# Patient Record
Sex: Female | Born: 1990 | Race: Black or African American | Hispanic: No | Marital: Single | Smoking: Former smoker
Health system: Southern US, Community
[De-identification: ages and names within clinical notes are randomized; demographics above are authoritative.]

## PROBLEM LIST (undated history)

## (undated) HISTORY — PX: CHOLECYSTECTOMY: SHX55

---

## 2016-06-06 ENCOUNTER — Ambulatory Visit: Payer: Medicaid Other | Admitting: Sports Medicine

## 2016-07-02 ENCOUNTER — Ambulatory Visit: Payer: Medicaid Other | Admitting: Sports Medicine

## 2019-03-09 ENCOUNTER — Emergency Department (HOSPITAL_BASED_OUTPATIENT_CLINIC_OR_DEPARTMENT_OTHER)
Admission: EM | Admit: 2019-03-09 | Discharge: 2019-03-09 | Disposition: A | Discharge status: Home / Self Care | Payer: Medicaid Other | Attending: Emergency Medicine | Admitting: Emergency Medicine

## 2019-03-09 ENCOUNTER — Other Ambulatory Visit: Payer: Self-pay

## 2019-03-09 ENCOUNTER — Encounter (HOSPITAL_BASED_OUTPATIENT_CLINIC_OR_DEPARTMENT_OTHER): Payer: Self-pay

## 2019-03-09 DIAGNOSIS — F17210 Nicotine dependence, cigarettes, uncomplicated: Secondary | ICD-10-CM | POA: Insufficient documentation

## 2019-03-09 DIAGNOSIS — N760 Acute vaginitis: Secondary | ICD-10-CM | POA: Diagnosis not present

## 2019-03-09 DIAGNOSIS — N898 Other specified noninflammatory disorders of vagina: Secondary | ICD-10-CM | POA: Diagnosis present

## 2019-03-09 DIAGNOSIS — B9689 Other specified bacterial agents as the cause of diseases classified elsewhere: Secondary | ICD-10-CM

## 2019-03-09 DIAGNOSIS — N3000 Acute cystitis without hematuria: Secondary | ICD-10-CM | POA: Diagnosis not present

## 2019-03-09 LAB — URINALYSIS, MICROSCOPIC (REFLEX)

## 2019-03-09 LAB — URINALYSIS, ROUTINE W REFLEX MICROSCOPIC
Bilirubin Urine: NEGATIVE
Glucose, UA: NEGATIVE mg/dL
Ketones, ur: NEGATIVE mg/dL
Leukocytes,Ua: NEGATIVE
Nitrite: POSITIVE — AB
Protein, ur: NEGATIVE mg/dL
Specific Gravity, Urine: 1.025 (ref 1.005–1.030)
pH: 7 (ref 5.0–8.0)

## 2019-03-09 LAB — WET PREP, GENITAL
Sperm: NONE SEEN
Trich, Wet Prep: NONE SEEN
Yeast Wet Prep HPF POC: NONE SEEN

## 2019-03-09 LAB — PREGNANCY, URINE: Preg Test, Ur: NEGATIVE

## 2019-03-09 MED ORDER — CEPHALEXIN 500 MG PO CAPS: 500.0000 mg | ORAL_CAPSULE | Freq: Three times a day (TID) | ORAL | 0 refills | Status: DC

## 2019-03-09 MED ORDER — ONDANSETRON 4 MG PO TBDP
4.0000 mg | ORAL_TABLET | Freq: Once | ORAL | Status: AC
  Administered 2019-03-09: 19:00:00 4 mg via ORAL
  Filled 2019-03-09: qty 1

## 2019-03-09 MED ORDER — METRONIDAZOLE 500 MG PO TABS
2000.0000 mg | ORAL_TABLET | Freq: Once | ORAL | Status: AC
  Administered 2019-03-09: 2000 mg via ORAL
  Filled 2019-03-09: qty 4

## 2019-03-09 NOTE — ED Notes (Signed)
Patient stated that she has cramping feeling to her RUQ because she is on her period???

## 2019-03-09 NOTE — ED Triage Notes (Signed)
Pt reports +STD exposure-vaginal d/c x 2 weeks-NAD-steady gait 

## 2019-03-09 NOTE — ED Notes (Signed)
ED Provider at bedside. 

## 2019-03-09 NOTE — Discharge Instructions (Signed)
Your testing today did not show trichomonas, but you do have bacterial vaginosis (not a STD) and also a urinary tract infection. Your medication you received in the ER will treat the bacterial vaginosis. The prescription is a different antibiotic to treat the UTI.

## 2019-03-11 LAB — URINE CULTURE: Culture: >=100000 — AB

## 2019-03-11 LAB — GC/CHLAMYDIA PROBE AMP (~~LOC~~) NOT AT ARMC
Chlamydia: NEGATIVE
Neisseria Gonorrhea: NEGATIVE

## 2019-03-11 NOTE — ED Provider Notes (Signed)
[facility] EMERGENCY DEPARTMENT Provider Note   CSN: [id number] Arrival date & time: 03/09/19  1646     History   Chief Complaint Chief Complaint  Patient presents with  . Exposure to STD  . Vaginal Discharge    HPI Sabrina Walter is a 28 y.o. female.     HPI   28 year old female presenting requesting STD testing.  She reports her boyfriend was recently treated for trichomonas.  She reports about 2-week history of vaginal discharge and some dysuria.  No abdominal pain.  No unusual bleeding.  She does not think she is pregnant.  History reviewed. No pertinent past medical history.  There are no active problems to display for this patient.   Past Surgical History:  Procedure Laterality Date  . CESAREAN SECTION    . CHOLECYSTECTOMY       OB History   No obstetric history on file.      Home Medications    Prior to Admission medications   Medication Sig Start Date End Date Taking? Authorizing Provider  cephALEXin (KEFLEX) 500 MG capsule Take 1 capsule (500 mg total) by mouth 3 (three) times daily. 03/09/19   Raeford Razor, MD    Family History No family history on file.  Social History Social History   Tobacco Use  . Smoking status: Current Every Day Smoker    Types: Cigarettes  . Smokeless tobacco: Never Used  Substance Use Topics  . Alcohol use: Yes    Frequency: Never    Comment: occ  . Drug use: Never     Allergies   Patient has no known allergies.   Review of Systems Review of Systems  All systems reviewed and negative, other than as noted in HPI.  Physical Exam Updated Vital Signs BP 137/84 (BP Location: Right Arm)   Pulse 72   Temp (!) 97.5 F (36.4 C) (Oral)   Resp 16   Ht 5\' 9"  (1.753 m)   Wt 121.6 kg   LMP 03/06/2019   SpO2 100%   BMI 39.58 kg/m   Physical Exam Vitals signs and nursing note reviewed.  Constitutional:      General: She is not in acute distress.    Appearance: She is well-developed.  HENT:      Head: Normocephalic and atraumatic.  Eyes:     General:        Right eye: No discharge.        Left eye: No discharge.     Conjunctiva/sclera: Conjunctivae normal.  Neck:     Musculoskeletal: Neck supple.  Cardiovascular:     Rate and Rhythm: Normal rate and regular rhythm.     Heart sounds: Normal heart sounds. No murmur. No friction rub. No gallop.   Pulmonary:     Effort: Pulmonary effort is normal. No respiratory distress.     Breath sounds: Normal breath sounds.  Abdominal:     General: There is no distension.     Palpations: Abdomen is soft.     Tenderness: There is no abdominal tenderness.  Genitourinary:    Comments: Chaperone present.  Normal external female genitalia.  No concerning lesions noted.  Moderate mucoid whitish discharge.  Cervix grossly normal in appearance.  No CMT or adnexal mass/tenderness. Musculoskeletal:        General: No tenderness.  Skin:    General: Skin is warm and dry.  Neurological:     Mental Status: She is alert.  Psychiatric:  Behavior: Behavior normal.        Thought Content: Thought content normal.      ED Treatments / Results  Labs (all labs ordered are listed, but only abnormal results are displayed) Labs Reviewed  WET PREP, GENITAL - Abnormal; Notable for the following components:      Result Value   Clue Cells Wet Prep HPF POC PRESENT (*)    WBC, Wet Prep HPF POC FEW (*)    All other components within normal limits  URINE CULTURE - Abnormal; Notable for the following components:   Culture >=100,000 COLONIES/mL ESCHERICHIA COLI (*)    Organism ID, Bacteria ESCHERICHIA COLI (*)    All other components within normal limits  URINALYSIS, ROUTINE W REFLEX MICROSCOPIC - Abnormal; Notable for the following components:   APPearance CLOUDY (*)    Hgb urine dipstick LARGE (*)    Nitrite POSITIVE (*)    All other components within normal limits  URINALYSIS, MICROSCOPIC (REFLEX) - Abnormal; Notable for the following  components:   Bacteria, UA MANY (*)    All other components within normal limits  PREGNANCY, URINE  GC/CHLAMYDIA PROBE AMP ([place]) NOT AT [facility]    EKG None  Radiology No results found.  Procedures Procedures (including critical care time)  Medications Ordered in ED Medications  metroNIDAZOLE (FLAGYL) tablet 2,000 mg (2,000 mg Oral Given 03/09/19 1847)  ondansetron (ZOFRAN-ODT) disintegrating tablet 4 mg (4 mg Oral Given 03/09/19 1847)     Initial Impression / Assessment and Plan / ED Course  I have reviewed the triage vital signs and the nursing notes.  Pertinent labs & imaging results that were available during my care of the patient were reviewed by me and considered in my medical decision making (see chart for details).        28 year old female with vaginal discharge and reported exposure to trichomonas.  Negative for trichomonas today but does appear to have a UTI and bacterial vaginosis.  Flagyl should cover for possible trichomoniasis regardless.  Additional antibiotics for UTI.  Return precautions were discussed.  Final Clinical Impressions(s) / ED Diagnoses   Final diagnoses:  Acute cystitis without hematuria  BV (bacterial vaginosis)    ED Discharge Orders         Ordered    cephALEXin (KEFLEX) 500 MG capsule  3 times daily     03/09/19 1831           Virgel Manifold, MD 03/11/19 1635

## 2019-03-13 ENCOUNTER — Telehealth: Payer: Self-pay | Admitting: Emergency Medicine

## 2019-03-13 NOTE — Telephone Encounter (Signed)
Post ED Visit - Positive Culture Follow-up  Culture report reviewed by antimicrobial stewardship pharmacist: [place] Team []  Elenor Quinones, Pharm.D. []  Heide Guile, Pharm.D., BCPS AQ-ID []  Parks Neptune, Pharm.D., BCPS []  Alycia Rossetti, Pharm.D., BCPS []  [place], [place].D., BCPS, AAHIVP []  Legrand Como, Pharm.D., BCPS, AAHIVP []  Salome Arnt, PharmD, BCPS []  Johnnette Gourd, PharmD, BCPS []  Hughes Better, PharmD, BCPS [x]  Elicia Lamp, PharmD []  Laqueta Linden, PharmD, BCPS []  Albertina Parr, PharmD  [place] Team []  Leodis Sias, PharmD []  Lindell Spar, PharmD []  Royetta Asal, PharmD []  Graylin Shiver, Rph []  Rema Fendt) Glennon Mac, PharmD []  Arlyn Dunning, PharmD []  Netta Cedars, PharmD []  Dia Sitter, PharmD []  Leone Haven, PharmD []  Gretta Arab, PharmD []  Theodis Shove, PharmD []  Peggyann Juba, PharmD []  Reuel Boom, PharmD   Positive urine culture Treated with Cephalexin, organism sensitive to the same and no further patient follow-up is required at this time.  Sandi Raveling Rena Sweeden 03/13/2019, 2:37 PM

## 2019-05-09 ENCOUNTER — Other Ambulatory Visit: Payer: Self-pay

## 2019-05-09 ENCOUNTER — Encounter (HOSPITAL_BASED_OUTPATIENT_CLINIC_OR_DEPARTMENT_OTHER): Payer: Self-pay

## 2019-05-09 ENCOUNTER — Emergency Department (HOSPITAL_BASED_OUTPATIENT_CLINIC_OR_DEPARTMENT_OTHER)
Admission: EM | Admit: 2019-05-09 | Discharge: 2019-05-09 | Disposition: A | Discharge status: Home / Self Care | Payer: Medicaid Other | Attending: Emergency Medicine | Admitting: Emergency Medicine

## 2019-05-09 DIAGNOSIS — R109 Unspecified abdominal pain: Secondary | ICD-10-CM | POA: Insufficient documentation

## 2019-05-09 DIAGNOSIS — Z5320 Procedure and treatment not carried out because of patient's decision for unspecified reasons: Secondary | ICD-10-CM | POA: Insufficient documentation

## 2019-05-09 LAB — URINALYSIS, ROUTINE W REFLEX MICROSCOPIC
Bilirubin Urine: NEGATIVE
Glucose, UA: NEGATIVE mg/dL
Hgb urine dipstick: NEGATIVE
Ketones, ur: NEGATIVE mg/dL
Leukocytes,Ua: NEGATIVE
Nitrite: NEGATIVE
Protein, ur: NEGATIVE mg/dL
Specific Gravity, Urine: >1.03 — ABNORMAL HIGH (ref 1.005–1.030)
pH: 6 (ref 5.0–8.0)

## 2019-05-09 LAB — PREGNANCY, URINE: Preg Test, Ur: POSITIVE — AB

## 2019-05-09 NOTE — ED Triage Notes (Signed)
Pt c/o right side abd pain, n/v x 2 weeks-pt states she had a pos home preg test-can not recall LMP-NAD-steady gait

## 2019-05-10 ENCOUNTER — Encounter (HOSPITAL_COMMUNITY)
Admission: AD | Disposition: A | Discharge status: Home / Self Care | Payer: Self-pay | Source: Home / Self Care | Attending: Emergency Medicine

## 2019-05-10 ENCOUNTER — Inpatient Hospital Stay (HOSPITAL_COMMUNITY): Payer: Medicaid Other

## 2019-05-10 ENCOUNTER — Inpatient Hospital Stay (HOSPITAL_COMMUNITY): Payer: Medicaid Other | Admitting: Certified Registered Nurse Anesthetist

## 2019-05-10 ENCOUNTER — Ambulatory Visit (HOSPITAL_BASED_OUTPATIENT_CLINIC_OR_DEPARTMENT_OTHER)
Admission: AD | Admit: 2019-05-10 | Discharge: 2019-05-10 | Disposition: A | Discharge status: Home / Self Care | Payer: Medicaid Other | Attending: Obstetrics & Gynecology | Admitting: Obstetrics & Gynecology

## 2019-05-10 ENCOUNTER — Encounter: Payer: Self-pay | Admitting: Oncology

## 2019-05-10 DIAGNOSIS — O00101 Right tubal pregnancy without intrauterine pregnancy: Secondary | ICD-10-CM | POA: Diagnosis not present

## 2019-05-10 DIAGNOSIS — Z3A08 8 weeks gestation of pregnancy: Secondary | ICD-10-CM | POA: Insufficient documentation

## 2019-05-10 DIAGNOSIS — Z20828 Contact with and (suspected) exposure to other viral communicable diseases: Secondary | ICD-10-CM | POA: Insufficient documentation

## 2019-05-10 DIAGNOSIS — O99331 Smoking (tobacco) complicating pregnancy, first trimester: Secondary | ICD-10-CM | POA: Insufficient documentation

## 2019-05-10 DIAGNOSIS — O009 Unspecified ectopic pregnancy without intrauterine pregnancy: Secondary | ICD-10-CM | POA: Diagnosis present

## 2019-05-10 DIAGNOSIS — R1031 Right lower quadrant pain: Secondary | ICD-10-CM

## 2019-05-10 DIAGNOSIS — F17210 Nicotine dependence, cigarettes, uncomplicated: Secondary | ICD-10-CM | POA: Diagnosis not present

## 2019-05-10 DIAGNOSIS — R109 Unspecified abdominal pain: Secondary | ICD-10-CM

## 2019-05-10 DIAGNOSIS — O26899 Other specified pregnancy related conditions, unspecified trimester: Secondary | ICD-10-CM

## 2019-05-10 HISTORY — PX: DIAGNOSTIC LAPAROSCOPY WITH REMOVAL OF ECTOPIC PREGNANCY: SHX6449

## 2019-05-10 LAB — WET PREP, GENITAL
Clue Cells Wet Prep HPF POC: NONE SEEN
Sperm: NONE SEEN
Trich, Wet Prep: NONE SEEN

## 2019-05-10 LAB — URINALYSIS, ROUTINE W REFLEX MICROSCOPIC
Bilirubin Urine: NEGATIVE
Glucose, UA: NEGATIVE mg/dL
Ketones, ur: NEGATIVE mg/dL
Leukocytes,Ua: NEGATIVE
Nitrite: NEGATIVE
Protein, ur: NEGATIVE mg/dL
Specific Gravity, Urine: >1.03 — ABNORMAL HIGH (ref 1.005–1.030)
pH: 6 (ref 5.0–8.0)

## 2019-05-10 LAB — CBC WITH DIFFERENTIAL/PLATELET
Abs Immature Granulocytes: 0.01 10*3/uL (ref 0.00–0.07)
Basophils Absolute: 0 10*3/uL (ref 0.0–0.1)
Basophils Relative: 1 %
Eosinophils Absolute: 0.2 10*3/uL (ref 0.0–0.5)
Eosinophils Relative: 2 %
HCT: 38.3 % (ref 36.0–46.0)
Hemoglobin: 12.7 g/dL (ref 12.0–15.0)
Immature Granulocytes: 0 %
Lymphocytes Relative: 34 %
Lymphs Abs: 2.7 10*3/uL (ref 0.7–4.0)
MCH: 31.1 pg (ref 26.0–34.0)
MCHC: 33.2 g/dL (ref 30.0–36.0)
MCV: 93.6 fL (ref 80.0–100.0)
Monocytes Absolute: 0.7 10*3/uL (ref 0.1–1.0)
Monocytes Relative: 9 %
Neutro Abs: 4.3 10*3/uL (ref 1.7–7.7)
Neutrophils Relative %: 54 %
Platelets: 224 10*3/uL (ref 150–400)
RBC: 4.09 MIL/uL (ref 3.87–5.11)
RDW: 13.1 % (ref 11.5–15.5)
WBC: 7.9 10*3/uL (ref 4.0–10.5)
nRBC: 0 % (ref 0.0–0.2)

## 2019-05-10 LAB — ABO/RH: ABO/RH(D): A POS

## 2019-05-10 LAB — URINALYSIS, MICROSCOPIC (REFLEX)

## 2019-05-10 LAB — HCG, QUANTITATIVE, PREGNANCY: hCG, Beta Chain, Quant, S: 38497 m[IU]/mL — ABNORMAL HIGH (ref <5)

## 2019-05-10 LAB — SARS CORONAVIRUS 2 BY RT PCR (HOSPITAL ORDER, PERFORMED IN ~~LOC~~ HOSPITAL LAB): SARS Coronavirus 2: NEGATIVE

## 2019-05-10 LAB — TYPE AND SCREEN
ABO/RH(D): A POS
Antibody Screen: NEGATIVE

## 2019-05-10 SURGERY — LAPAROSCOPY, WITH ECTOPIC PREGNANCY SURGICAL TREATMENT
Anesthesia: General | Laterality: Right

## 2019-05-10 MED ORDER — BUPIVACAINE HCL (PF) 0.25 % IJ SOLN
INTRAMUSCULAR | Status: AC
  Filled 2019-05-10: qty 30

## 2019-05-10 MED ORDER — PROPOFOL 10 MG/ML IV BOLUS
INTRAVENOUS | Status: AC
  Filled 2019-05-10: qty 20

## 2019-05-10 MED ORDER — FENTANYL CITRATE (PF) 100 MCG/2ML IJ SOLN
25.0000 ug | INTRAMUSCULAR | Status: DC | PRN
  Administered 2019-05-10: 25 ug via INTRAVENOUS
  Administered 2019-05-10: 75 ug via INTRAVENOUS

## 2019-05-10 MED ORDER — BUPIVACAINE HCL 0.5 % IJ SOLN
INTRAMUSCULAR | Status: DC | PRN
  Administered 2019-05-10: 20 mL

## 2019-05-10 MED ORDER — OXYCODONE HCL 5 MG PO TABS
ORAL_TABLET | ORAL | Status: AC
  Filled 2019-05-10: qty 2

## 2019-05-10 MED ORDER — FENTANYL CITRATE (PF) 100 MCG/2ML IJ SOLN
INTRAMUSCULAR | Status: AC
  Filled 2019-05-10: qty 2

## 2019-05-10 MED ORDER — LACTATED RINGERS IV SOLN: INTRAVENOUS | Status: DC

## 2019-05-10 MED ORDER — IBUPROFEN 800 MG PO TABS: 800.0000 mg | ORAL_TABLET | Freq: Three times a day (TID) | ORAL | 0 refills | Status: AC | PRN

## 2019-05-10 MED ORDER — SUGAMMADEX SODIUM 200 MG/2ML IV SOLN
INTRAVENOUS | Status: DC | PRN
  Administered 2019-05-10: 200 mg via INTRAVENOUS

## 2019-05-10 MED ORDER — LIDOCAINE 2% (20 MG/ML) 5 ML SYRINGE
INTRAMUSCULAR | Status: DC | PRN
  Administered 2019-05-10: 60 mg via INTRAVENOUS

## 2019-05-10 MED ORDER — KETOROLAC TROMETHAMINE 30 MG/ML IJ SOLN
INTRAMUSCULAR | Status: DC | PRN
  Administered 2019-05-10: 30 mg via INTRAVENOUS

## 2019-05-10 MED ORDER — PHENYLEPHRINE HCL (PRESSORS) 10 MG/ML IV SOLN
INTRAVENOUS | Status: DC | PRN
  Administered 2019-05-10: 150 ug via INTRAVENOUS
  Administered 2019-05-10: 60 ug via INTRAVENOUS

## 2019-05-10 MED ORDER — ROCURONIUM BROMIDE 10 MG/ML (PF) SYRINGE
PREFILLED_SYRINGE | INTRAVENOUS | Status: AC
  Filled 2019-05-10: qty 10

## 2019-05-10 MED ORDER — OXYCODONE HCL 5 MG PO TABS
5.0000 mg | ORAL_TABLET | Freq: Once | ORAL | Status: AC
  Administered 2019-05-10: 10 mg via ORAL

## 2019-05-10 MED ORDER — PROPOFOL 10 MG/ML IV BOLUS
INTRAVENOUS | Status: DC | PRN
  Administered 2019-05-10: 100 mg via INTRAVENOUS
  Administered 2019-05-10: 200 mg via INTRAVENOUS

## 2019-05-10 MED ORDER — SODIUM CHLORIDE 0.9 % IR SOLN
Status: DC | PRN
  Administered 2019-05-10: 1000 mL

## 2019-05-10 MED ORDER — KETOROLAC TROMETHAMINE 15 MG/ML IJ SOLN: 15.0000 mg | INTRAMUSCULAR | Status: DC

## 2019-05-10 MED ORDER — ONDANSETRON HCL 4 MG/2ML IJ SOLN: 4.0000 mg | Freq: Once | INTRAMUSCULAR | Status: DC | PRN

## 2019-05-10 MED ORDER — KETOROLAC TROMETHAMINE 30 MG/ML IJ SOLN
INTRAMUSCULAR | Status: AC
  Filled 2019-05-10: qty 1

## 2019-05-10 MED ORDER — DEXAMETHASONE SODIUM PHOSPHATE 10 MG/ML IJ SOLN
INTRAMUSCULAR | Status: DC | PRN
  Administered 2019-05-10: 5 mg via INTRAVENOUS

## 2019-05-10 MED ORDER — SUCCINYLCHOLINE CHLORIDE 200 MG/10ML IV SOSY
PREFILLED_SYRINGE | INTRAVENOUS | Status: DC | PRN
  Administered 2019-05-10: 120 mg via INTRAVENOUS

## 2019-05-10 MED ORDER — MIDAZOLAM HCL 2 MG/2ML IJ SOLN
INTRAMUSCULAR | Status: AC
  Filled 2019-05-10: qty 2

## 2019-05-10 MED ORDER — MIDAZOLAM HCL 2 MG/2ML IJ SOLN
INTRAMUSCULAR | Status: DC | PRN
  Administered 2019-05-10: 2 mg via INTRAVENOUS

## 2019-05-10 MED ORDER — FENTANYL CITRATE (PF) 250 MCG/5ML IJ SOLN
INTRAMUSCULAR | Status: AC
  Filled 2019-05-10: qty 5

## 2019-05-10 MED ORDER — ONDANSETRON HCL 4 MG/2ML IJ SOLN
INTRAMUSCULAR | Status: DC | PRN
  Administered 2019-05-10: 4 mg via INTRAVENOUS

## 2019-05-10 MED ORDER — HYDROCODONE-ACETAMINOPHEN 5-325 MG PO TABS: 1.0000 | ORAL_TABLET | Freq: Four times a day (QID) | ORAL | 0 refills | Status: DC | PRN

## 2019-05-10 MED ORDER — FENTANYL CITRATE (PF) 250 MCG/5ML IJ SOLN
INTRAMUSCULAR | Status: DC | PRN
  Administered 2019-05-10: 100 ug via INTRAVENOUS
  Administered 2019-05-10 (×6): 50 ug via INTRAVENOUS

## 2019-05-10 MED ORDER — DEXAMETHASONE SODIUM PHOSPHATE 10 MG/ML IJ SOLN
INTRAMUSCULAR | Status: AC
  Filled 2019-05-10: qty 1

## 2019-05-10 MED ORDER — ONDANSETRON HCL 4 MG/2ML IJ SOLN
INTRAMUSCULAR | Status: AC
  Filled 2019-05-10: qty 2

## 2019-05-10 MED ORDER — ROCURONIUM BROMIDE 10 MG/ML (PF) SYRINGE
PREFILLED_SYRINGE | INTRAVENOUS | Status: DC | PRN
  Administered 2019-05-10: 40 mg via INTRAVENOUS
  Administered 2019-05-10: 10 mg via INTRAVENOUS

## 2019-05-10 MED ORDER — SOD CITRATE-CITRIC ACID 500-334 MG/5ML PO SOLN
30.0000 mL | ORAL | Status: AC
  Administered 2019-05-10: 30 mL via ORAL
  Filled 2019-05-10: qty 30

## 2019-05-10 MED ORDER — BUPIVACAINE HCL 0.5 % IJ SOLN
INTRAMUSCULAR | Status: AC
  Filled 2019-05-10: qty 1

## 2019-05-10 SURGICAL SUPPLY — 39 items
APPLICATOR ARISTA FLEXITIP XL (MISCELLANEOUS) IMPLANT
DERMABOND ADHESIVE PROPEN (GAUZE/BANDAGES/DRESSINGS) ×1
DERMABOND ADVANCED (GAUZE/BANDAGES/DRESSINGS) ×1
DERMABOND ADVANCED .7 DNX12 (GAUZE/BANDAGES/DRESSINGS) ×1 IMPLANT
DERMABOND ADVANCED .7 DNX6 (GAUZE/BANDAGES/DRESSINGS) IMPLANT
DRSG OPSITE POSTOP 3X4 (GAUZE/BANDAGES/DRESSINGS) ×2 IMPLANT
DURAPREP 26ML APPLICATOR (WOUND CARE) ×2 IMPLANT
GLOVE BIO SURGEON STRL SZ7.5 (GLOVE) ×2 IMPLANT
GLOVE BIOGEL PI IND STRL 7.0 (GLOVE) ×2 IMPLANT
GLOVE BIOGEL PI IND STRL 8 (GLOVE) ×1 IMPLANT
GLOVE BIOGEL PI INDICATOR 7.0 (GLOVE) ×2
GLOVE BIOGEL PI INDICATOR 8 (GLOVE) ×1
GOWN STRL REUS W/ TWL LRG LVL3 (GOWN DISPOSABLE) ×1 IMPLANT
GOWN STRL REUS W/ TWL XL LVL3 (GOWN DISPOSABLE) ×1 IMPLANT
GOWN STRL REUS W/TWL LRG LVL3 (GOWN DISPOSABLE) ×1
GOWN STRL REUS W/TWL XL LVL3 (GOWN DISPOSABLE) ×1
HEMOSTAT ARISTA ABSORB 3G PWDR (HEMOSTASIS) IMPLANT
KIT TURNOVER KIT B (KITS) ×2 IMPLANT
NDL INSUFF ACCESS 14 VERSASTEP (NEEDLE) ×1 IMPLANT
NS IRRIG 1000ML POUR BTL (IV SOLUTION) ×2 IMPLANT
PACK LAPAROSCOPY BASIN (CUSTOM PROCEDURE TRAY) ×2 IMPLANT
PACK TRENDGUARD 450 HYBRID PRO (MISCELLANEOUS) ×1 IMPLANT
POUCH SPECIMEN RETRIEVAL 10MM (ENDOMECHANICALS) IMPLANT
PROTECTOR NERVE ULNAR (MISCELLANEOUS) ×4 IMPLANT
SET IRRIG TUBING LAPAROSCOPIC (IRRIGATION / IRRIGATOR) ×1 IMPLANT
SET TUBE SMOKE EVAC HIGH FLOW (TUBING) ×2 IMPLANT
SHEARS HARMONIC ACE PLUS 36CM (ENDOMECHANICALS) ×1 IMPLANT
SLEEVE ENDOPATH XCEL 5M (ENDOMECHANICALS) ×1 IMPLANT
SLEEVE XCEL OPT CAN 5 100 (ENDOMECHANICALS) ×2 IMPLANT
SUT MNCRL AB 4-0 PS2 18 (SUTURE) ×2 IMPLANT
SUT VICRYL 0 UR6 27IN ABS (SUTURE) ×2 IMPLANT
TOWEL GREEN STERILE FF (TOWEL DISPOSABLE) ×4 IMPLANT
TRAY FOLEY W/BAG SLVR 14FR (SET/KITS/TRAYS/PACK) ×2 IMPLANT
TRENDGUARD 450 HYBRID PRO PACK (MISCELLANEOUS) ×2
TROCAR BALLN 12MMX100 BLUNT (TROCAR) ×2 IMPLANT
TROCAR VERSASTEP PLUS 12MM (TROCAR) IMPLANT
TROCAR VERSASTEP PLUS 5MM (TROCAR) IMPLANT
TROCAR XCEL NON-BLD 5MMX100MML (ENDOMECHANICALS) ×2 IMPLANT
WARMER LAPAROSCOPE (MISCELLANEOUS) ×2 IMPLANT

## 2019-05-10 NOTE — Transfer of Care (Signed)
Immediate Anesthesia Transfer of Care Note  Patient: Sabrina Walter  Procedure(s) Performed: DIAGNOSTIC LAPAROSCOPY WITH REMOVAL OF ECTOPIC PREGNANCY (Right )  Patient Location: PACU  Anesthesia Type:General  Level of Consciousness: awake and alert   Airway & Oxygen Therapy: Patient Spontanous Breathing and Patient connected to nasal cannula oxygen  Post-op Assessment: Report given to RN, Post -op Vital signs reviewed and stable and Patient moving all extremities X 4  Post vital signs: Reviewed and stable  Last Vitals:  Vitals Value Taken Time  BP 113/60 05/10/19 1047  Temp    Pulse 93 05/10/19 1047  Resp 14 05/10/19 1047  SpO2 100 % 05/10/19 1047  Vitals shown include unvalidated device data.  Last Pain:  Vitals:   05/10/19 0656  TempSrc:   PainSc: 0-No pain      Patients Stated Pain Goal: 6 (91/47/82 9562)  Complications: No apparent anesthesia complications

## 2019-05-10 NOTE — ED Provider Notes (Signed)
[facility] EMERGENCY DEPARTMENT Provider Note   CSN: [id number] Arrival date & time: 05/10/19  0356     History Chief Complaint  Patient presents with  . Abdominal Pain    Sabrina Walter is a 28 y.o. female.  HPI     This is a 28 year old G3, P2 female with recent positive pregnancy test who presents with abdominal pain.  Patient reports that she had a positive home pregnancy test on Thursday.  She has developed some crampy right lower quadrant pain.  It has become more persistent.  Nothing seems to make it better or worse.  It does not radiate.  Currently her pain is a 6 out of 10.  She has not noted any urinary symptoms.  She does report intermittent vaginal bleeding but no loss of clots.  She was seen and evaluated at [facility] but left before full evaluation.  She states that she had to go to work.  She is unsure of when her next last menstrual period was but believes it was October.  Review of outside records from December 18 reveal that she had a positive quant of 24,249.  She was also noted to be a positive.  She did not have any imaging to verify location of pregnancy.    History reviewed. No pertinent past medical history.  There are no problems to display for this patient.   Past Surgical History:  Procedure Laterality Date  . CESAREAN SECTION    . CHOLECYSTECTOMY       OB History    Gravida  1   Para      Term      Preterm      AB      Living        SAB      TAB      Ectopic      Multiple      Live Births              No family history on file.  Social History   Tobacco Use  . Smoking status: Current Every Day Smoker    Types: Cigarettes  . Smokeless tobacco: Never Used  Substance Use Topics  . Alcohol use: Not Currently  . Drug use: Never    Home Medications Prior to Admission medications   Medication Sig Start Date End Date Taking? Authorizing Provider  cephALEXin (KEFLEX) 500 MG capsule Take 1 capsule  (500 mg total) by mouth 3 (three) times daily. 03/09/19   Virgel Manifold, MD    Allergies    Patient has no known allergies.  Review of Systems   Review of Systems  Constitutional: Negative for fever.  Respiratory: Negative for shortness of breath.   Cardiovascular: Negative for chest pain.  Gastrointestinal: Positive for abdominal pain. Negative for nausea and vomiting.  Genitourinary: Positive for vaginal bleeding. Negative for dysuria and vaginal discharge.  All other systems reviewed and are negative.   Physical Exam Updated Vital Signs BP 132/69 (BP Location: Right Arm)   Pulse 67   Temp (!) 97.5 F (36.4 C) (Oral)   Resp 18   Ht 1.753 m (5\' 9" )   Wt 115.2 kg   LMP 03/06/2019   SpO2 100%   BMI 37.51 kg/m   Physical Exam Vitals and nursing note reviewed.  Constitutional:      Appearance: She is well-developed. She is obese. She is not ill-appearing.  HENT:     Head: Normocephalic and atraumatic.  Eyes:  Pupils: Pupils are equal, round, and reactive to light.  Cardiovascular:     Rate and Rhythm: Normal rate and regular rhythm.     Heart sounds: Normal heart sounds.  Pulmonary:     Effort: Pulmonary effort is normal. No respiratory distress.     Breath sounds: No wheezing.  Abdominal:     General: Bowel sounds are normal.     Palpations: Abdomen is soft.     Tenderness: There is abdominal tenderness in the right lower quadrant. There is guarding. There is no rebound.  Genitourinary:    Comments: No significant bleeding noted on exam, cervical os closed, exquisite tenderness to palpation over the right adnexa, no palpable mass noted Musculoskeletal:     Cervical back: Neck supple.  Skin:    General: Skin is warm and dry.  Neurological:     Mental Status: She is alert and oriented to person, place, and time.  Psychiatric:        Mood and Affect: Mood normal.    Bedside US   1.6 x 1.29 cm  ED Results / Procedures / Treatments   Labs (all labs  ordered are listed, but only abnormal results are displayed) Labs Reviewed  URINALYSIS, ROUTINE W REFLEX MICROSCOPIC - Abnormal; Notable for the following components:      Result Value   APPearance HAZY (*)    Specific Gravity, Urine >1.030 (*)    Hgb urine dipstick TRACE (*)    All other components within normal limits  HCG, QUANTITATIVE, PREGNANCY - Abnormal; Notable for the following components:   hCG, Beta Chain, Quant, S 38,497 (*)    All other components within normal limits  URINALYSIS, MICROSCOPIC (REFLEX) - Abnormal; Notable for the following components:   Bacteria, UA MANY (*)    All other components within normal limits  SARS CORONAVIRUS 2 BY RT PCR (HOSPITAL ORDER, PERFORMED IN [facility])  CBC WITH DIFFERENTIAL/PLATELET    EKG None  Radiology No results found.  Procedures Ultrasound ED OB Pelvic  Date/Time: 05/10/2019 5:37 AM Performed by: Shon Baton, MD Authorized by: Shon Baton, MD   Procedure details:    Indications: evaluate for IUP and pregnant with abdominal pain     Assess:  Intrauterine pregnancy   Technique:  Transabdominal obstetric (HCG+) exam   Images: archived   Study Limitations: body habitus Uterine findings:    Endometrial stripe: identified     Intrauterine pregnancy: not identified     Single gestation: identified     Fetal pole: identified     Fetal heart rate: identified      Right ovary findings:     Adnexal mass: identified Comments:     Transabdominal ultrasound shows no obvious intrauterine pregnancy, there is a 1.6 x 1.29 cm gestational sac in the area of the right ovary with what appears to be a fetal pole and fetal heart rate   (including critical care time)  CRITICAL CARE Performed by: Shon Baton   Total critical care time: 45 minutes  Critical care time was exclusive of separately billable procedures and treating other patients.  Critical care was necessary to treat or prevent  imminent or life-threatening deterioration.  Critical care was time spent personally by me on the following activities: development of treatment plan with patient and/or surrogate as well as nursing, discussions with consultants, evaluation of patient's response to treatment, examination of patient, obtaining history from patient or surrogate, ordering and performing treatments and interventions, ordering  and review of laboratory studies, ordering and review of radiographic studies, pulse oximetry and re-evaluation of patient's condition.  Medications Ordered in ED Medications - No data to display  ED Course  I have reviewed the triage vital signs and the nursing notes.  Pertinent labs & imaging results that were available during my care of the patient were reviewed by me and considered in my medical decision making (see chart for details).    MDM Rules/Calculators/A&P                      Patient presents with abdominal pain in the setting of recent positive pregnancy test.  Unknown last menstrual period.  Abdominal pain does lateralize.  She is tender in the right lower quadrant on both abdominal and vaginal exams.  Bedside ultrasound does not show any obvious intrauterine pregnancy.  Given documented quant of 24,000, would expect to see an intrauterine pregnancy.  There is what appears to be a gestational sac in the area of the right ovary that is 1.66 x 1.2 cm with what appears to be a fetal pole and fetal heartbeat.  This is highly suspicious for an ectopic pregnancy.  Patient is clinically stable and well-appearing.  She is not hypotensive.  I discussed with her my findings and my suspicion for an ectopic pregnancy.  She was made n.p.o.  Covid testing was sent.  She was A positive at outside hospital.  No indication for RhoGam.  P quant here is still pending.  I discussed the patient with Dr. Despina HiddenEure who has accepted the patient in transfer.   Final Clinical Impression(s) / ED Diagnoses Final  diagnoses:  RLQ abdominal pain  Ectopic pregnancy without intrauterine pregnancy, unspecified location    Rx / DC Orders ED Discharge Orders    None       Shon BatonHorton, Dijon Kohlman F, MD 05/10/19 [phone]

## 2019-05-10 NOTE — ED Notes (Signed)
[facility] notified Sabrina Walter) - patient needs transport to [facility]

## 2019-05-10 NOTE — Progress Notes (Signed)
2 bags of belongings taken to PACU. One bag had two pearl looking earrings in denture cup that was labeled that was removed in Short Stay.

## 2019-05-10 NOTE — Discharge Instructions (Signed)
Ectopic Pregnancy  An ectopic pregnancy happens when a fertilized egg grows outside the womb (uterus). The fertilized egg cannot stay alive outside of the womb. This problem often happens in a fallopian tube. It is often caused by damage to the tube. If this problem is found early, you may be treated with medicine that stops the egg from growing. If your tube tears or bursts open (ruptures), you will bleed inside. Often, there is very bad pain in the lower belly. This is an emergency. You will need surgery. Get help right away. Follow these instructions at home: After being treated with medicine or surgery:  Rest and limit your activity for as long as told by your doctor.  Until your doctor says that it is safe: ? Do not lift anything that is heavier than 10 lb (4.5 kg) or the limit that your doctor tells you. ? Avoid exercise and any movement that takes a lot of effort.  To prevent problems when pooping (constipation): ? Eat a healthy diet. This includes:  Fruits.  Vegetables.  Whole grains. ? Drink 6-8 glasses of water a day. Contact a doctor if: Get help right away if:  You have sudden and very bad pain in your belly.  You have very bad pain in your shoulders or neck.  You have pain that gets worse and is not helped by medicine.  You have: ? A fever or chills. ? Vaginal bleeding. ? Redness or swelling at the site of a surgical cut (incision).  You feel sick to your stomach (nauseous) or you throw up (vomit).  You feel dizzy or weak.  You feel light-headed or you pass out (faint). Summary  An ectopic pregnancy happens when a fertilized egg grows outside the womb (uterus).  If this problem is found early, you may be treated with medicine that stops the egg from growing.  If your tube tears or bursts open (ruptures), you will need surgery. This is an emergency. Get help right away. This information is not intended to replace advice given to you by your health care  provider. Make sure you discuss any questions you have with your health care provider. Document Released: 08/01/2008 Document Revised: 04/17/2017 Document Reviewed: 05/29/2016 Elsevier Patient Education  2020 Elsevier Inc.  

## 2019-05-10 NOTE — Op Note (Signed)
[facility] PROCEDURE DATE: 05/10/2019  PREOPERATIVE DIAGNOSIS: Right ectopic pregnancy POSTOPERATIVE DIAGNOSIS:  right fallopian tube ectopic pregnancy PROCEDURE: Laparoscopic right salpingectomy and removal of ectopic pregnancy SURGEON:  Dr. Arlina Robes ANESTHESIOLOGIST: Suzette Battiest, MD Anesthesiologist: Suzette Battiest, MD CRNA: Verdie Drown, CRNA Student Nurse Anesthetist: Georgina Peer, RN  INDICATIONS: 28 y.o. [phone] at [redacted]w[redacted]d here with the preoperative diagnoses as listed above.  Please refer to preoperative notes for more details. Patient was counseled regarding need for laparoscopic salpingectomy. Risks of surgery including bleeding which may require transfusion or reoperation, infection, injury to bowel or other surrounding organs, need for additional procedures including laparotomy and other postoperative/anesthesia complications were explained to patient.  Written informed consent was obtained.  FINDINGS:  Dilated right fallopian tube containing ectopic gestation. Small normal appearing uterus, normal left fallopian tube, right ovary and left ovary.  ANESTHESIA: General INTRAVENOUS FLUIDS: As recorded ESTIMATED BLOOD LOSS: 20 ml URINE OUTPUT: As recorded SPECIMENS: Right fallopian tube containing ectopic gestation COMPLICATIONS: None immediate  PROCEDURE IN DETAIL:  The patient was taken to the operating room where general anesthesia was administered and was found to be adequate.  She was placed in the dorsal lithotomy position, and was prepped and draped in a sterile manner.  A Foley catheter was inserted into her bladder and attached to constant drainage and a uterine manipulator was then advanced into the uterus .    After an adequate timeout was performed, attention was turned to the abdomen where an umbilical incision was made with the scalpel. Fascia was exposed with S retractors and grasped with Kocher clamps. Fascia was cut with Mayo . Peritoneum was  grasped with tonsil clamp and cut with Metzenbaum . The connors of the Fascia were then secured with 0 Vicryl.  A 12 -mm Excel Hessian trochar with sleeve was then placed without difficulty and the connors were secured with 0 vicryl.   The abdomen was then insufflated with carbon dioxide gas and adequate pneumoperitoneum was obtained.  A survey of the patient's pelvis and abdomen revealed the findings above.  Two 5-mm left lower quadrant ports were then placed under direct visualization. The adhesions as noted above were dissected and ligate with the Ligasure without problems.   Attention was then turned to the right fallopian tube which was grasped and ligated from the underlying mesosalpinx and uterine attachment using the Harmonic instrument.  Good hemostasis was noted.  The specimen was placed in an EndoCatch bag and removed from the abdomen intact.  The abdomen was irrigated and blood clots removed. Inspection of all ligated sites revealed good hemostasis. The abdomen was desufflated, and all instruments were removed.  The fascial incision was closed with 0 Vicryl an interrupted suture of 3-0 Vicryl was used to closed the subcutaneous space.The umbilical incision was closed with 4-0 Vicryl and the lateral incisions were secured with Dermabond. The uterine manipulator and foley cathter was removed.  The patient tolerated the procedure well.  All instruments, needles, and sponge counts were correct x 2. The patient was taken to the recovery room in stable condition.   The patient will be discharged to home as per PACU criteria.  Routine postoperative instructions given.  She was prescribed Percocet and  Ibuprofen   She will follow up in the clinic in about 2-3 weeks for postoperative evaluation.  Arlina Robes, MD, [place] Attending [place], [facility]

## 2019-05-10 NOTE — Progress Notes (Signed)
Corey Skains, RN in PACU notified that blood type is pending and to follow up with results prior to discharge.

## 2019-05-10 NOTE — H&P (Signed)
Sabrina Walter is an 28 y.o. female G3 P2002 with LMP 03/16/19. Present to [facility] with abd/pelvic pain. W/U revealed probable right ectopic pregnancy with  cardiac activity. Transferred to [facility] for further management and surgery.   H/O C/S x 2   H/O chlamydia in the past Last pap unknown  Menstrual History: Menarche age: 59 Patient's last menstrual period was 03/13/2019.    History reviewed. No pertinent past medical history.  Past Surgical History:  Procedure Laterality Date  . CESAREAN SECTION    . CHOLECYSTECTOMY    . CHOLECYSTECTOMY      History reviewed. No pertinent family history.  Social History:  reports that she has been smoking cigarettes. She has never used smokeless tobacco. She reports previous alcohol use. She reports that she does not use drugs.  Allergies: No Known Allergies  Medications Prior to Admission  Medication Sig Dispense Refill Last Dose  . cephALEXin (KEFLEX) 500 MG capsule Take 1 capsule (500 mg total) by mouth 3 (three) times daily. 15 capsule 0     Review of Systems  Constitutional: Negative.   Respiratory: Negative.   Cardiovascular: Negative.   Gastrointestinal: Positive for abdominal pain.  Genitourinary: Positive for pelvic pain.    Blood pressure (!) 114/56, pulse 73, temperature (!) 97.5 F (36.4 C), resp. rate 19, height 5\' 9"  (1.753 m), weight 115.2 kg, last menstrual period 03/13/2019, SpO2 100 %. Physical Exam  Constitutional: She appears well-developed and well-nourished.  Cardiovascular: Normal rate and regular rhythm.  Respiratory: Effort normal and breath sounds normal.  GI: Soft. Bowel sounds are normal. There is abdominal tenderness.  Genitourinary:    Genitourinary Comments: Deferred to OR     Results for orders placed or performed during the hospital encounter of 05/10/19 (from the past 24 hour(s))  Urinalysis, Routine w reflex microscopic     Status: Abnormal   Collection Time: 05/10/19  4:33 AM  Result Value Ref Range    Color, Urine YELLOW YELLOW   APPearance HAZY (A) CLEAR   Specific Gravity, Urine >1.030 (H) 1.005 - 1.030   pH 6.0 5.0 - 8.0   Glucose, UA NEGATIVE NEGATIVE mg/dL   Hgb urine dipstick TRACE (A) NEGATIVE   Bilirubin Urine NEGATIVE NEGATIVE   Ketones, ur NEGATIVE NEGATIVE mg/dL   Protein, ur NEGATIVE NEGATIVE mg/dL   Nitrite NEGATIVE NEGATIVE   Leukocytes,Ua NEGATIVE NEGATIVE  CBC with Differential     Status: None   Collection Time: 05/10/19  4:33 AM  Result Value Ref Range   WBC 7.9 4.0 - 10.5 K/uL   RBC 4.09 3.87 - 5.11 MIL/uL   Hemoglobin 12.7 12.0 - 15.0 g/dL   HCT 05/12/19 08.6 - 57.8 %   MCV 93.6 80.0 - 100.0 fL   MCH 31.1 26.0 - 34.0 pg   MCHC 33.2 30.0 - 36.0 g/dL   RDW 46.9 62.9 - 52.8 %   Platelets 224 150 - 400 K/uL   nRBC 0.0 0.0 - 0.2 %   Neutrophils Relative % 54 %   Neutro Abs 4.3 1.7 - 7.7 K/uL   Lymphocytes Relative 34 %   Lymphs Abs 2.7 0.7 - 4.0 K/uL   Monocytes Relative 9 %   Monocytes Absolute 0.7 0.1 - 1.0 K/uL   Eosinophils Relative 2 %   Eosinophils Absolute 0.2 0.0 - 0.5 K/uL   Basophils Relative 1 %   Basophils Absolute 0.0 0.0 - 0.1 K/uL   Immature Granulocytes 0 %   Abs Immature Granulocytes 0.01 0.00 - 0.07  K/uL  hCG, quantitative, pregnancy     Status: Abnormal   Collection Time: 05/10/19  4:33 AM  Result Value Ref Range   hCG, Beta Chain, Quant, S 38,497 (H) <5 mIU/mL  Urinalysis, Microscopic (reflex)     Status: Abnormal   Collection Time: 05/10/19  4:33 AM  Result Value Ref Range   RBC / HPF 0-5 0 - 5 RBC/hpf   WBC, UA 0-5 0 - 5 WBC/hpf   Bacteria, UA MANY (A) NONE SEEN   Squamous Epithelial / LPF 6-10 0 - 5  SARS Coronavirus 2 by RT PCR (hospital order, performed in [place] hospital lab) Nasopharyngeal Nasopharyngeal Swab     Status: None   Collection Time: 05/10/19  5:35 AM   Specimen: Nasopharyngeal Swab  Result Value Ref Range   SARS Coronavirus 2 NEGATIVE NEGATIVE  Wet prep, genital     Status: Abnormal   Collection Time:  05/10/19  6:47 AM  Result Value Ref Range   Yeast Wet Prep HPF POC PRESENT (A) NONE SEEN   Trich, Wet Prep NONE SEEN NONE SEEN   Clue Cells Wet Prep HPF POC NONE SEEN NONE SEEN   WBC, Wet Prep HPF POC FEW (A) NONE SEEN   Sperm NONE SEEN   Type and screen     Status: None   Collection Time: 05/10/19  7:38 AM  Result Value Ref Range   ABO/RH(D) A POS    Antibody Screen NEG    Sample Expiration      05/13/2019,2359 Performed at [facility], [address] [place], [place] [address]     US OB LESS THAN 14 WEEKS WITH [place] TRANSVAGINAL  Result Date: 05/10/2019 CLINICAL DATA:  Crampy abdominal pain. EXAM: OBSTETRIC <14 WK [place] AND TRANSVAGINAL OB US TECHNIQUE: Both transabdominal and transvaginal ultrasound examinations were performed for complete evaluation of the gestation as well as the maternal uterus, adnexal regions, and pelvic cul-de-sac. Transvaginal technique was performed to assess early pregnancy. COMPARISON:  CT 11/10/2018. FINDINGS: Intrauterine gestational sac: None visualized Right adnexal gestational sac, yolk sac, and embryo with cardiac activity noted. Findings consistent with live ectopic pregnancy. Heart Rate: 182 bpm CRL: 12.2 mm   7 w   3 d                  [place] EDC: 12/24/2019 Subchorionic hemorrhage:  None visualized. Maternal uterus/adnexae: 3.2 x 2.8 x 2.7 cm right adnexal mass consistent with live right adnexal ectopic pregnancy as above. IMPRESSION: Live right adnexal ectopic pregnancy at 7 weeks 3 days. Critical Value/emergent results were called by telephone at the time of interpretation on 05/10/2019 at 7:21 am to providerMelanie , who verbally acknowledged these results. Electronically Signed   By: Marcello Moores  Register   On: 05/10/2019 07:31    Assessment/Plan: Probable ectopic pregnancy.  Dx reviewed with pt. D/T cardiac activity, pt is not a candidate for MTX therapy. Surgerical management recommended to pt. Dx laparoscopy reviewed with pt. R/B/post -op care  reviewed with pt. Pt verbalized understanding. To OR.   Chancy Milroy 05/10/2019, 8:56 AM

## 2019-05-10 NOTE — Progress Notes (Signed)
pvag check: no drainage/bleeding noted

## 2019-05-10 NOTE — Anesthesia Postprocedure Evaluation (Signed)
Anesthesia Post Note  Patient: Sabrina Walter  Procedure(s) Performed: DIAGNOSTIC LAPAROSCOPY WITH REMOVAL OF ECTOPIC PREGNANCY (Right )     Patient location during evaluation: PACU Anesthesia Type: General Level of consciousness: awake and alert Pain management: pain level controlled Vital Signs Assessment: post-procedure vital signs reviewed and stable Respiratory status: spontaneous breathing, nonlabored ventilation, respiratory function stable and patient connected to nasal cannula oxygen Cardiovascular status: blood pressure returned to baseline and stable Postop Assessment: no apparent nausea or vomiting Anesthetic complications: no    Last Vitals:  Vitals:   05/10/19 1112 05/10/19 1130  BP: 121/68 126/84  Pulse: 69 71  Resp: 14   Temp:  (!) 36.3 C  SpO2: 99% 100%    Last Pain:  Vitals:   05/10/19 1130  TempSrc:   PainSc: 8                  Tiajuana Amass

## 2019-05-10 NOTE — MAU Provider Note (Signed)
History     CSN: [id number]  Arrival date and time: 05/10/19 [id number]   First Provider Initiated Contact with Patient 05/10/19 [phone]      Chief Complaint  Patient presents with  . Abdominal Pain   28 y.o. [id number] @[redacted]w[redacted]d  by sure LMP presenting with LAP being transferred from [facility]. Pain started a few days ago. Pain is worse on the right. Reports minimal pain currently. Denies VB or vaginal discharge. Endorses dysuria but no other urinary sx. No fevers.   OB History    Gravida  3   Para  2   Term  2   Preterm      AB      Living  2     SAB      TAB      Ectopic      Multiple      Live Births  2           History reviewed. No pertinent past medical history.  Past Surgical History:  Procedure Laterality Date  . CESAREAN SECTION    . CHOLECYSTECTOMY    . CHOLECYSTECTOMY      History reviewed. No pertinent family history.  Social History   Tobacco Use  . Smoking status: Current Every Day Smoker    Types: Cigarettes  . Smokeless tobacco: Never Used  Substance Use Topics  . Alcohol use: Not Currently  . Drug use: Never    Allergies: No Known Allergies  Medications Prior to Admission  Medication Sig Dispense Refill Last Dose  . cephALEXin (KEFLEX) 500 MG capsule Take 1 capsule (500 mg total) by mouth 3 (three) times daily. 15 capsule 0     Review of Systems  Constitutional: Negative for chills and fever.  Gastrointestinal: Positive for abdominal pain.  Genitourinary: Positive for dysuria. Negative for frequency, hematuria, urgency, vaginal bleeding and vaginal discharge.   Physical Exam   Blood pressure 128/61, pulse 74, temperature (!) 97.5 F (36.4 C), resp. rate 19, height 5\' 9"  (1.753 m), weight 115.2 kg, last menstrual period 03/13/2019, SpO2 100 %.  Physical Exam  Nursing note and vitals reviewed. Constitutional: She is oriented to person, place, and time. She appears well-developed and well-nourished. No distress.  HENT:   Head: Normocephalic and atraumatic.  Cardiovascular: Normal rate.  Respiratory: Effort normal. No respiratory distress.  GI: Soft. She exhibits no distension and no mass. There is no abdominal tenderness. There is no rebound and no guarding.  Genitourinary:    Genitourinary Comments: deferred   Musculoskeletal:        General: Normal range of motion.     Cervical back: Normal range of motion.  Neurological: She is alert and oriented to person, place, and time.  Skin: Skin is warm and dry.  Psychiatric: She has a normal mood and affect.   Results for orders placed or performed during the hospital encounter of 05/10/19 (from the past 24 hour(s))  Urinalysis, Routine w reflex microscopic     Status: Abnormal   Collection Time: 05/10/19  4:33 AM  Result Value Ref Range   Color, Urine YELLOW YELLOW   APPearance HAZY (A) CLEAR   Specific Gravity, Urine >1.030 (H) 1.005 - 1.030   pH 6.0 5.0 - 8.0   Glucose, UA NEGATIVE NEGATIVE mg/dL   Hgb urine dipstick TRACE (A) NEGATIVE   Bilirubin Urine NEGATIVE NEGATIVE   Ketones, ur NEGATIVE NEGATIVE mg/dL   Protein, ur NEGATIVE NEGATIVE mg/dL   Nitrite NEGATIVE NEGATIVE   Leukocytes,Ua  NEGATIVE NEGATIVE  CBC with Differential     Status: None   Collection Time: 05/10/19  4:33 AM  Result Value Ref Range   WBC 7.9 4.0 - 10.5 K/uL   RBC 4.09 3.87 - 5.11 MIL/uL   Hemoglobin 12.7 12.0 - 15.0 g/dL   HCT 38.3 36.0 - 46.0 %   MCV 93.6 80.0 - 100.0 fL   MCH 31.1 26.0 - 34.0 pg   MCHC 33.2 30.0 - 36.0 g/dL   RDW 13.1 11.5 - 15.5 %   Platelets 224 150 - 400 K/uL   nRBC 0.0 0.0 - 0.2 %   Neutrophils Relative % 54 %   Neutro Abs 4.3 1.7 - 7.7 K/uL   Lymphocytes Relative 34 %   Lymphs Abs 2.7 0.7 - 4.0 K/uL   Monocytes Relative 9 %   Monocytes Absolute 0.7 0.1 - 1.0 K/uL   Eosinophils Relative 2 %   Eosinophils Absolute 0.2 0.0 - 0.5 K/uL   Basophils Relative 1 %   Basophils Absolute 0.0 0.0 - 0.1 K/uL   Immature Granulocytes 0 %   Abs  Immature Granulocytes 0.01 0.00 - 0.07 K/uL  hCG, quantitative, pregnancy     Status: Abnormal   Collection Time: 05/10/19  4:33 AM  Result Value Ref Range   hCG, Beta Chain, Quant, S 38,497 (H) <5 mIU/mL  Urinalysis, Microscopic (reflex)     Status: Abnormal   Collection Time: 05/10/19  4:33 AM  Result Value Ref Range   RBC / HPF 0-5 0 - 5 RBC/hpf   WBC, UA 0-5 0 - 5 WBC/hpf   Bacteria, UA MANY (A) NONE SEEN   Squamous Epithelial / LPF 6-10 0 - 5  SARS Coronavirus 2 by RT PCR (hospital order, performed in [place] hospital lab) Nasopharyngeal Nasopharyngeal Swab     Status: None   Collection Time: 05/10/19  5:35 AM   Specimen: Nasopharyngeal Swab  Result Value Ref Range   SARS Coronavirus 2 NEGATIVE NEGATIVE  Wet prep, genital     Status: Abnormal   Collection Time: 05/10/19  6:47 AM  Result Value Ref Range   Yeast Wet Prep HPF POC PRESENT (A) NONE SEEN   Trich, Wet Prep NONE SEEN NONE SEEN   Clue Cells Wet Prep HPF POC NONE SEEN NONE SEEN   WBC, Wet Prep HPF POC FEW (A) NONE SEEN   Sperm NONE SEEN    US OB LESS THAN 14 WEEKS WITH OB TRANSVAGINAL  Result Date: 05/10/2019 CLINICAL DATA:  Crampy abdominal pain. EXAM: OBSTETRIC <14 WK [place] AND TRANSVAGINAL OB US TECHNIQUE: Both transabdominal and transvaginal ultrasound examinations were performed for complete evaluation of the gestation as well as the maternal uterus, adnexal regions, and pelvic cul-de-sac. Transvaginal technique was performed to assess early pregnancy. COMPARISON:  CT 11/10/2018. FINDINGS: Intrauterine gestational sac: None visualized Right adnexal gestational sac, yolk sac, and embryo with cardiac activity noted. Findings consistent with live ectopic pregnancy. Heart Rate: 182 bpm CRL: 12.2 mm   7 w   3 d                  [place] EDC: 12/24/2019 Subchorionic hemorrhage:  None visualized. Maternal uterus/adnexae: 3.2 x 2.8 x 2.7 cm right adnexal mass consistent with live right adnexal ectopic pregnancy as above. IMPRESSION:  Live right adnexal ectopic pregnancy at 7 weeks 3 days. Critical Value/emergent results were called by telephone at the time of interpretation on 05/10/2019 at 7:21 am to providerMelanie , who verbally acknowledged  these results. Electronically Signed   By: Maisie Fus  Register   On: 05/10/2019 07:31   MAU Course  Procedures  MDM Review of records: note from [facility] states concern for right live ectopic pregnancy based on bedside transabdominal US. TVUS ordered. Declines need for analgesic.  0720: Dr. Despina Hidden notified of [place] results. Plan for OR. NPO since 0115.  Assessment and Plan  Right live ectopic pregnancy Admit to OR Mngt per MD  Donette Larry, CNM 05/10/2019, 8:03 AM

## 2019-05-10 NOTE — ED Notes (Signed)
Spoke with patient's friend; gave her update that patient will be sent to MAU for further evaluation; gave patient's friend patient's house key.

## 2019-05-10 NOTE — ED Triage Notes (Signed)
Pt c/o RLQ pain since 12/18.  Reports recently finding out she is pregnant however unsure how far along.  Pt states she has also had n/v.  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

## 2019-05-10 NOTE — MAU Note (Signed)
Report given to Heath Lark, [place] stay nurse.

## 2019-05-10 NOTE — Anesthesia Preprocedure Evaluation (Signed)
Anesthesia Evaluation  Patient identified by MRN, date of birth, ID band Patient awake    Reviewed: Allergy & Precautions, NPO status , Patient's Chart, lab work & pertinent test results  Airway Mallampati: II  TM Distance: >3 FB     Dental  (+) Dental Advisory Given   Pulmonary Current Smoker,    breath sounds clear to auscultation       Cardiovascular negative cardio ROS   Rhythm:Regular Rate:Normal     Neuro/Psych negative neurological ROS     GI/Hepatic negative GI ROS, Neg liver ROS,   Endo/Other  negative endocrine ROS  Renal/GU negative Renal ROS     Musculoskeletal   Abdominal   Peds  Hematology negative hematology ROS (+)   Anesthesia Other Findings   Reproductive/Obstetrics (+) Pregnancy                             Lab Results  Component Value Date   WBC 7.9 05/10/2019   HGB 12.7 05/10/2019   HCT 38.3 05/10/2019   MCV 93.6 05/10/2019   PLT 224 05/10/2019   No results found for: CREATININE, BUN, NA, K, CL, CO2  Anesthesia Physical Anesthesia Plan  ASA: II and emergent  Anesthesia Plan: General   Post-op Pain Management:    Induction: Intravenous and Rapid sequence  PONV Risk Score and Plan: 2 and Dexamethasone, Ondansetron and Treatment may vary due to age or medical condition  Airway Management Planned: Oral ETT  Additional Equipment:   Intra-op Plan:   Post-operative Plan: Extubation in OR  Informed Consent: I have reviewed the patients History and Physical, chart, labs and discussed the procedure including the risks, benefits and alternatives for the proposed anesthesia with the patient or authorized representative who has indicated his/her understanding and acceptance.     Dental advisory given  Plan Discussed with: CRNA  Anesthesia Plan Comments:         Anesthesia Quick Evaluation

## 2019-05-11 LAB — GC/CHLAMYDIA PROBE AMP (~~LOC~~) NOT AT ARMC
Chlamydia: NEGATIVE
Comment: NEGATIVE
Comment: NORMAL
Neisseria Gonorrhea: NEGATIVE

## 2019-05-11 LAB — SURGICAL PATHOLOGY

## 2019-05-18 ENCOUNTER — Telehealth: Payer: Self-pay | Admitting: General Practice

## 2019-05-18 NOTE — Telephone Encounter (Signed)
Patient called and left message on nurse voicemail line stating she had surgery at the [facility] recently and needs a note faxed to her job ASAP so she can return to work.   Called patient, no answer- left message stating we are trying to reach you to see if you still need assistance, if so please call us back.

## 2019-05-19 ENCOUNTER — Encounter: Payer: Self-pay | Admitting: Family Medicine

## 2019-05-31 ENCOUNTER — Other Ambulatory Visit: Payer: Self-pay

## 2019-05-31 ENCOUNTER — Encounter: Payer: Self-pay | Admitting: Obstetrics and Gynecology

## 2019-05-31 ENCOUNTER — Telehealth: Payer: Medicaid Other | Admitting: Obstetrics and Gynecology

## 2019-05-31 DIAGNOSIS — Z9889 Other specified postprocedural states: Secondary | ICD-10-CM

## 2019-05-31 NOTE — Progress Notes (Signed)
I connected with  Sabrina Walter on 05/31/19 at 11:15 AM EST by telephone and verified that I am speaking with the correct person using two identifiers.   I discussed the limitations, risks, security and privacy concerns of performing an evaluation and management service by telephone and the availability of in person appointments. I also discussed with the patient that there may be a patient responsible charge related to this service. The patient expressed understanding and agreed to proceed.  Henrietta Dine, CMA 05/31/2019  11:02 AM

## 2019-05-31 NOTE — Progress Notes (Signed)
Called pt repeatedly to Connect to My Chart for visit, could not reach Pt, sent text via My Chart & she never clicked on it. Could not reach thru phone# on file.   As noted above. Unable to connect with pt for her post op appt. Pt will be rescheduled

## 2019-06-16 ENCOUNTER — Ambulatory Visit: Payer: Medicaid Other | Admitting: Obstetrics and Gynecology

## 2020-02-05 ENCOUNTER — Emergency Department (HOSPITAL_BASED_OUTPATIENT_CLINIC_OR_DEPARTMENT_OTHER)
Admission: EM | Admit: 2020-02-05 | Discharge: 2020-02-05 | Disposition: A | Discharge status: Home / Self Care | Payer: Medicaid Other | Attending: Emergency Medicine | Admitting: Emergency Medicine

## 2020-02-05 ENCOUNTER — Other Ambulatory Visit: Payer: Self-pay

## 2020-02-05 ENCOUNTER — Encounter (HOSPITAL_BASED_OUTPATIENT_CLINIC_OR_DEPARTMENT_OTHER): Payer: Self-pay | Admitting: Emergency Medicine

## 2020-02-05 DIAGNOSIS — M545 Low back pain, unspecified: Secondary | ICD-10-CM

## 2020-02-05 DIAGNOSIS — M5441 Lumbago with sciatica, right side: Secondary | ICD-10-CM | POA: Insufficient documentation

## 2020-02-05 DIAGNOSIS — F17210 Nicotine dependence, cigarettes, uncomplicated: Secondary | ICD-10-CM | POA: Diagnosis not present

## 2020-02-05 LAB — PREGNANCY, URINE: Preg Test, Ur: NEGATIVE

## 2020-02-05 MED ORDER — KETOROLAC TROMETHAMINE 60 MG/2ML IM SOLN
60.0000 mg | Freq: Once | INTRAMUSCULAR | Status: AC
  Administered 2020-02-05: 60 mg via INTRAMUSCULAR
  Filled 2020-02-05: qty 2

## 2020-02-05 MED ORDER — METHYLPREDNISOLONE 4 MG PO TBPK: ORAL_TABLET | ORAL | 0 refills | Status: AC

## 2020-02-05 NOTE — ED Provider Notes (Signed)
[facility] EMERGENCY DEPARTMENT Provider Note   CSN: [id number] Arrival date & time: 02/05/20  1031     History Chief Complaint  Patient presents with  . Back Pain    Sabrina Walter is a 29 y.o. female.  The history is provided by the patient.  Back Pain Location:  Lumbar spine Quality:  Aching Pain severity:  Mild Onset quality:  Gradual Timing:  Intermittent Progression:  Waxing and waning Chronicity:  New Relieved by:  Nothing Worsened by:  Palpation, ambulation and movement Associated symptoms: no abdominal pain, no abdominal swelling, no bladder incontinence, no bowel incontinence, no chest pain, no dysuria, no fever, no headaches, no leg pain, no numbness, no paresthesias, no pelvic pain, no perianal numbness, no tingling, no weakness and no weight loss        History reviewed. No pertinent past medical history.  Patient Active Problem List   Diagnosis Date Noted  . Post-operative state 05/31/2019    Past Surgical History:  Procedure Laterality Date  . CESAREAN SECTION    . CHOLECYSTECTOMY    . CHOLECYSTECTOMY    . DIAGNOSTIC LAPAROSCOPY WITH REMOVAL OF ECTOPIC PREGNANCY Right 05/10/2019   Procedure: DIAGNOSTIC LAPAROSCOPY WITH REMOVAL OF ECTOPIC PREGNANCY;  Surgeon: Hermina Staggers, MD;  Location: [facility] OR;  Service: Gynecology;  Laterality: Right;     OB History    Gravida  3   Para  2   Term  2   Preterm      AB      Living  2     SAB      TAB      Ectopic      Multiple      Live Births  2           No family history on file.  Social History   Tobacco Use  . Smoking status: Current Every Day Smoker    Types: Cigarettes  . Smokeless tobacco: Never Used  Vaping Use  . Vaping Use: Never used  Substance Use Topics  . Alcohol use: Yes  . Drug use: Never    Home Medications Prior to Admission medications   Medication Sig Start Date End Date Taking? Authorizing Provider  ibuprofen (ADVIL) 800 MG tablet Take 1  tablet (800 mg total) by mouth every 8 (eight) hours as needed. 05/10/19   Hermina Staggers, MD  methylPREDNISolone (MEDROL DOSEPAK) 4 MG TBPK tablet Follow package insert 02/05/20   Virgina Norfolk, DO    Allergies    Patient has no known allergies.  Review of Systems   Review of Systems  Constitutional: Negative for chills, fever and weight loss.  HENT: Negative for ear pain and sore throat.   Eyes: Negative for pain and visual disturbance.  Respiratory: Negative for cough and shortness of breath.   Cardiovascular: Negative for chest pain and palpitations.  Gastrointestinal: Negative for abdominal pain, bowel incontinence and vomiting.  Genitourinary: Negative for bladder incontinence, dysuria, hematuria and pelvic pain.  Musculoskeletal: Positive for back pain. Negative for arthralgias.  Skin: Negative for color change and rash.  Neurological: Negative for tingling, seizures, syncope, weakness, numbness, headaches and paresthesias.  All other systems reviewed and are negative.   Physical Exam Updated Vital Signs  ED Triage Vitals  Enc Vitals Group     BP 02/05/20 1040 105/83     Pulse Rate 02/05/20 1040 78     Resp 02/05/20 1040 18     Temp 02/05/20 1040 98.7 F (  37.1 C)     Temp Source 02/05/20 1040 Oral     SpO2 02/05/20 1040 99 %     Weight 02/05/20 1037 274 lb 14.4 oz (124.7 kg)     Height 02/05/20 1037 5\' 9"  (1.753 m)     Head Circumference --      Peak Flow --      Pain Score 02/05/20 1038 8     Pain Loc --      Pain Edu? --      Excl. in GC? --     Physical Exam Constitutional:      General: She is not in acute distress.    Appearance: She is not ill-appearing.  HENT:     Head: Normocephalic.     Mouth/Throat:     Mouth: Mucous membranes are moist.  Eyes:     Extraocular Movements: Extraocular movements intact.     Pupils: Pupils are equal, round, and reactive to light.  Cardiovascular:     Pulses: Normal pulses.  Pulmonary:     Effort: Pulmonary  effort is normal.     Breath sounds: Normal breath sounds.  Musculoskeletal:        General: Tenderness present. Normal range of motion.     Comments: No midline spinal tenderness.  Tenderness to paraspinal lumbar muscles on the left  Skin:    General: Skin is warm.     Capillary Refill: Capillary refill takes less than 2 seconds.  Neurological:     General: No focal deficit present.     Mental Status: She is alert and oriented to person, place, and time.     Sensory: No sensory deficit.     Motor: No weakness.     Comments: Antalgic gait, 5+ out of 5 strength throughout     ED Results / Procedures / Treatments   Labs (all labs ordered are listed, but only abnormal results are displayed) Labs Reviewed  PREGNANCY, URINE    EKG None  Radiology No results found.  Procedures Procedures (including critical care time)  Medications Ordered in ED Medications  ketorolac (TORADOL) injection 60 mg (has no administration in time range)    ED Course  I have reviewed the triage vital signs and the nursing notes.  Pertinent labs & imaging results that were available during my care of the patient were reviewed by me and considered in my medical decision making (see chart for details).    MDM Rules/Calculators/A&P                          Sabrina Walter is a 29 year old female presents the ED with back pain.  Tenderness to the paraspinal lumbar region.  No midline spinal tenderness.  Normal neurological exam.  No concern for cauda equina.  Does work [employer] job.  Suspect muscle spasm.  No urinary symptoms.  Pregnancy test is negative.  Given IM Toradol.  Will prescribe Solu-Medrol Dosepak as likely muscle spasm/sciatica.  Understands return precautions.  Recommend follow-up with primary care doctor if pain does not improve with anti-inflammatories and Tylenol.  Seems less likely kidney infection or kidney stone.  This chart was dictated using voice recognition software.  Despite  best efforts to proofread,  errors can occur which can change the documentation meaning.    Final Clinical Impression(s) / ED Diagnoses Final diagnoses:  Acute left-sided low back pain without sciatica    Rx / DC Orders ED Discharge Orders  Ordered    methylPREDNISolone (MEDROL DOSEPAK) 4 MG TBPK tablet        02/05/20 1108           CuratoloMadelaine Bhat, DO 02/05/20 1109

## 2020-02-05 NOTE — ED Triage Notes (Signed)
L low back pain since Monday after using a push mower on a hill.

## 2020-02-05 NOTE — Discharge Instructions (Signed)
Please take 600 mg of Motrin every 8 hours for the next 5 days and then as needed, take 1000 mg of Tylenol 4 times for the next 5 days and then as needed.  Take Medrol Dosepak as prescribed.  Do not overexert yourself.  If pain is not improved after treatment regimen or pain worsens please return.

## 2020-07-30 ENCOUNTER — Encounter (HOSPITAL_BASED_OUTPATIENT_CLINIC_OR_DEPARTMENT_OTHER): Payer: Self-pay

## 2020-07-30 ENCOUNTER — Other Ambulatory Visit: Payer: Self-pay

## 2020-07-30 ENCOUNTER — Emergency Department (HOSPITAL_BASED_OUTPATIENT_CLINIC_OR_DEPARTMENT_OTHER)
Admission: EM | Admit: 2020-07-30 | Discharge: 2020-07-30 | Disposition: A | Discharge status: Home / Self Care | Payer: Medicaid Other | Attending: Emergency Medicine | Admitting: Emergency Medicine

## 2020-07-30 DIAGNOSIS — K5900 Constipation, unspecified: Secondary | ICD-10-CM | POA: Insufficient documentation

## 2020-07-30 DIAGNOSIS — A599 Trichomoniasis, unspecified: Secondary | ICD-10-CM | POA: Diagnosis not present

## 2020-07-30 DIAGNOSIS — R1030 Lower abdominal pain, unspecified: Secondary | ICD-10-CM

## 2020-07-30 DIAGNOSIS — F17210 Nicotine dependence, cigarettes, uncomplicated: Secondary | ICD-10-CM | POA: Diagnosis not present

## 2020-07-30 LAB — COMPREHENSIVE METABOLIC PANEL
ALT: 16 U/L (ref 0–44)
AST: 18 U/L (ref 15–41)
Albumin: 3.9 g/dL (ref 3.5–5.0)
Alkaline Phosphatase: 37 U/L — ABNORMAL LOW (ref 38–126)
Anion gap: 7 (ref 5–15)
BUN: 7 mg/dL (ref 6–20)
CO2: 27 mmol/L (ref 22–32)
Calcium: 9 mg/dL (ref 8.9–10.3)
Chloride: 102 mmol/L (ref 98–111)
Creatinine, Ser: 0.72 mg/dL (ref 0.44–1.00)
GFR, Estimated: >60 mL/min (ref >60)
Glucose, Bld: 96 mg/dL (ref 70–99)
Potassium: 4 mmol/L (ref 3.5–5.1)
Sodium: 136 mmol/L (ref 135–145)
Total Bilirubin: 0.3 mg/dL (ref 0.3–1.2)
Total Protein: 7.5 g/dL (ref 6.5–8.1)

## 2020-07-30 LAB — URINALYSIS, MICROSCOPIC (REFLEX)

## 2020-07-30 LAB — CBC
HCT: 42.4 % (ref 36.0–46.0)
Hemoglobin: 14 g/dL (ref 12.0–15.0)
MCH: 30.8 pg (ref 26.0–34.0)
MCHC: 33 g/dL (ref 30.0–36.0)
MCV: 93.4 fL (ref 80.0–100.0)
Platelets: 263 10*3/uL (ref 150–400)
RBC: 4.54 MIL/uL (ref 3.87–5.11)
RDW: 12.7 % (ref 11.5–15.5)
WBC: 8.7 10*3/uL (ref 4.0–10.5)
nRBC: 0 % (ref 0.0–0.2)

## 2020-07-30 LAB — URINALYSIS, ROUTINE W REFLEX MICROSCOPIC
Bilirubin Urine: NEGATIVE
Glucose, UA: NEGATIVE mg/dL
Hgb urine dipstick: NEGATIVE
Ketones, ur: NEGATIVE mg/dL
Nitrite: NEGATIVE
Protein, ur: NEGATIVE mg/dL
Specific Gravity, Urine: 1.025 (ref 1.005–1.030)
pH: 6.5 (ref 5.0–8.0)

## 2020-07-30 LAB — PREGNANCY, URINE: Preg Test, Ur: NEGATIVE

## 2020-07-30 LAB — WET PREP, GENITAL
Sperm: NONE SEEN
Yeast Wet Prep HPF POC: NONE SEEN

## 2020-07-30 LAB — LIPASE, BLOOD: Lipase: 23 U/L (ref 11–51)

## 2020-07-30 LAB — OCCULT BLOOD X 1 CARD TO LAB, STOOL: Fecal Occult Bld: POSITIVE — AB

## 2020-07-30 MED ORDER — POLYETHYLENE GLYCOL 3350 17 GM/SCOOP PO POWD: 1.0000 | Freq: Three times a day (TID) | ORAL | 0 refills | Status: AC

## 2020-07-30 MED ORDER — ACETAMINOPHEN 325 MG PO TABS
650.0000 mg | ORAL_TABLET | Freq: Once | ORAL | Status: AC
  Administered 2020-07-30: 650 mg via ORAL
  Filled 2020-07-30: qty 2

## 2020-07-30 MED ORDER — METRONIDAZOLE 500 MG PO TABS: 500.0000 mg | ORAL_TABLET | Freq: Two times a day (BID) | ORAL | 0 refills | Status: DC

## 2020-07-30 NOTE — ED Triage Notes (Signed)
Pt c/o lower abd pain, constipation, blood in stools x 3 episodes in the last week-NAD-steady gait

## 2020-07-30 NOTE — ED Provider Notes (Signed)
[facility] EMERGENCY DEPARTMENT Provider Note   CSN: [id number] Arrival date & time: 07/30/20  1142     History Chief Complaint  Patient presents with  . Abdominal Pain    Sabrina Walter is a 30 y.o. female.  HPI Is a 30 year old female with past medical history significant for cholecystectomy, C-section no other past medical history.  She is presented today to the ER with 1 week of anus pain with straining with pooping.  She states that she has had some bright red blood PR for the past 1 week.  She states that this morning she noticed that she has abdominal pain that was generalized seem to be lower abdominal pain with no associated nausea, vomiting, chills, fever or diarrhea.  She states her stools continue to be somewhat hard.  She states that her last meal was earlier today was [employer].  No other associate symptoms.  She denies any vaginal discharge, dysuria, frequency or urgency. She states she currently has 2 sexual partners.     History reviewed. No pertinent past medical history.  Patient Active Problem List   Diagnosis Date Noted  . Post-operative state 05/31/2019    Past Surgical History:  Procedure Laterality Date  . CESAREAN SECTION    . CHOLECYSTECTOMY    . CHOLECYSTECTOMY    . DIAGNOSTIC LAPAROSCOPY WITH REMOVAL OF ECTOPIC PREGNANCY Right 05/10/2019   Procedure: DIAGNOSTIC LAPAROSCOPY WITH REMOVAL OF ECTOPIC PREGNANCY;  Surgeon: Hermina Staggers, MD;  Location: [facility] OR;  Service: Gynecology;  Laterality: Right;     OB History    Gravida  3   Para  2   Term  2   Preterm      AB      Living  2     SAB      IAB      Ectopic      Multiple      Live Births  2           No family history on file.  Social History   Tobacco Use  . Smoking status: Current Every Day Smoker    Types: Cigarettes  . Smokeless tobacco: Never Used  Vaping Use  . Vaping Use: Never used  Substance Use Topics  . Alcohol use: Yes    Comment: occ   . Drug use: Never    Home Medications Prior to Admission medications   Medication Sig Start Date End Date Taking? Authorizing Provider  metroNIDAZOLE (FLAGYL) 500 MG tablet Take 1 tablet (500 mg total) by mouth 2 (two) times daily. 07/30/20  Yes Shenika Quint S, PA  polyethylene glycol powder (GLYCOLAX/MIRALAX) 17 GM/SCOOP powder Take 255 g by mouth in the morning, at noon, and at bedtime. One scoop in beverage of your choice with each meal. You may increase if you continue to be constipated and decrease if your stool becomes too loose. 07/30/20  Yes Borghild Thaker S, PA  ibuprofen (ADVIL) 800 MG tablet Take 1 tablet (800 mg total) by mouth every 8 (eight) hours as needed. 05/10/19   Hermina Staggers, MD  methylPREDNISolone (MEDROL DOSEPAK) 4 MG TBPK tablet Follow package insert 02/05/20   Virgina Norfolk, DO    Allergies    Patient has no known allergies.  Review of Systems   Review of Systems  Constitutional: Negative for chills and fever.  HENT: Negative for congestion.   Eyes: Negative for pain.  Respiratory: Negative for cough and shortness of breath.   Cardiovascular: Negative for  chest pain and leg swelling.  Gastrointestinal: Positive for abdominal pain and constipation. Negative for diarrhea, nausea and vomiting.  Genitourinary: Negative for dysuria.  Musculoskeletal: Negative for myalgias.  Skin: Negative for rash.  Neurological: Negative for dizziness and headaches.    Physical Exam Updated Vital Signs BP 118/89   Pulse 79   Temp 98.3 F (36.8 C) (Oral)   Resp 18   Ht 5\' 9"  (1.753 m)   Wt 117 kg   LMP 07/23/2020   SpO2 99%   BMI 38.10 kg/m   Physical Exam Vitals and nursing note reviewed.  Constitutional:      General: She is not in acute distress. HENT:     Head: Normocephalic and atraumatic.     Nose: Nose normal.  Eyes:     General: No scleral icterus. Cardiovascular:     Rate and Rhythm: Normal rate and regular rhythm.     Pulses: Normal pulses.      Heart sounds: Normal heart sounds.  Pulmonary:     Effort: Pulmonary effort is normal. No respiratory distress.     Breath sounds: No wheezing.  Abdominal:     Palpations: Abdomen is soft.     Tenderness: There is no abdominal tenderness.     Comments: No significant abdominal tenderness on examination.  Genitourinary:    Comments: Vulva without lesions or abnormality Vaginal canal without abnormal discharge or lesion Cervix appears normal, is closed No adnexal tenderness or CMT  Rectal exam notable for no fissure no hemorrhoid obvious.  Brown stool.  No fecal impaction palpable. Guaiac positive. Musculoskeletal:     Cervical back: Normal range of motion.     Right lower leg: No edema.     Left lower leg: No edema.  Skin:    General: Skin is warm and dry.     Capillary Refill: Capillary refill takes less than 2 seconds.  Neurological:     Mental Status: She is alert. Mental status is at baseline.  Psychiatric:        Mood and Affect: Mood normal.        Behavior: Behavior normal.     ED Results / Procedures / Treatments   Labs (all labs ordered are listed, but only abnormal results are displayed) Labs Reviewed  COMPREHENSIVE METABOLIC PANEL - Abnormal; Notable for the following components:      Result Value   Alkaline Phosphatase 37 (*)    All other components within normal limits  URINALYSIS, ROUTINE W REFLEX MICROSCOPIC - Abnormal; Notable for the following components:   APPearance CLOUDY (*)    Leukocytes,Ua SMALL (*)    All other components within normal limits  URINALYSIS, MICROSCOPIC (REFLEX) - Abnormal; Notable for the following components:   Bacteria, UA FEW (*)    Trichomonas, UA PRESENT (*)    All other components within normal limits  WET PREP, GENITAL  LIPASE, BLOOD  CBC  PREGNANCY, URINE  OCCULT BLOOD X 1 CARD TO LAB, STOOL  GC/CHLAMYDIA PROBE AMP ([place]) NOT AT [facility]    EKG None  Radiology No results found.  Procedures Procedures    Medications Ordered in ED Medications  acetaminophen (TYLENOL) tablet 650 mg (650 mg Oral Given 07/30/20 1605)    ED Course  I have reviewed the triage vital signs and the nursing notes.  Pertinent labs & imaging results that were available during my care of the patient were reviewed by me and considered in my medical decision making (see chart for details).  Patient is 30 year old female presented today with symptoms discussed in HPI including some bright red blood PR for the past week intermittently, some lower abdominal pain that began today  Physical exam is unremarkable with no abdominal tenderness.  Exam without any ovarian/adnexal tenderness and no cervical motion tenderness is present.  We will provide patient with Tylenol for achy lower abdominal pain suspect this is secondary to some constipation.  Urinalysis with trichomonas present.  Few bacteria and small leukocytes low suspicion for urinary tract infection.  Lipase within normal except for otitis.  Urine pregnancy test is negative.  CBC without leukocytosis or anemia and CMP without electrolyte derangement, abnormal liver function or abnormal kidney function.  Clinical Course as of 07/30/20 1645  Mon Jul 30, 2020  1640 Pelvic and rectal examination were unremarkable.  Swabs and occult blood card sent to lab [WF]    Clinical Course User Index [WF] Gailen Shelter, [place]   Patient discharged with Flagyl twice daily for one week for trichomonas.  MiraLAX for constipation.  No obvious anal fissure or hemorrhoids on examination.  Will need to follow-up with gastroenterology for further evaluation of new seemingly worsened constipation.  She seems to have a relatively poor diet which is likely contributing.  We will treat for trichomonas, provide MiraLAX for constipation and strongly encourage close follow-up with PCP and gastroenterology.  MDM Rules/Calculators/A&P                          Final Clinical Impression(s) / ED  Diagnoses Final diagnoses:  Lower abdominal pain  Constipation, unspecified constipation type  Trichomonas infection    Rx / DC Orders ED Discharge Orders         Ordered    metroNIDAZOLE (FLAGYL) 500 MG tablet  2 times daily        07/30/20 1643    polyethylene glycol powder (GLYCOLAX/MIRALAX) 17 GM/SCOOP powder  3 times daily        07/30/20 [address] [place], [place] 07/30/20 1713    Pollyann Savoy, MD 07/30/20 2258

## 2020-07-30 NOTE — ED Notes (Signed)
Onset of abd pain this am. Points to area of LLQ. Appetite is poor, denies fevers, nausea or vomiting, has had some constipation, states she has seen a lot of blood in her stool. Dark colored blood is noted. She is very upset that she may have colon cancer. Pt reassured.

## 2020-07-30 NOTE — Discharge Instructions (Addendum)
You were diagnosed with trichomonas Please wait 2 weeks AND be sure that you and your partners are symptom free before returning to sexual activity. Please use protection with every sexual encounter.  Your stool appeared normal today however there was evidence of microscopic blood in your stool. Please follow up with a gastroenterologist. Please drink plenty of water.  It is important to increase your fiber intake.  Until you are able to do this by eating more vegetables and decreasing the carbohydrates and fast food I do recommend using MiraLAX may use 1 capful mixed into water or Gatorade or drink of your choice once daily and may increase this up to 3 times daily with meals.  Titrate to soft poop.  If you would like to do the cleanout "bomb" we discussed: Cleanout:  Miralax cleanout 5 capfuls in 24-36 oz gatorade, drink over 4-6 hrs. If stool is not clear after 24 hours, then repeat this dose for a second day.   After Cleanout:  Give Miralax 1 capful in 8 oz juice or gatorade daily for at least 4-6 weeks.  Schedule follow up with pediatrician if no improvement in constipation in 1-2 months, sooner if not resolved after cleanout.

## 2020-07-30 NOTE — ED Notes (Signed)
ED Provider at bedside. 

## 2020-07-31 LAB — GC/CHLAMYDIA PROBE AMP (~~LOC~~) NOT AT ARMC
Chlamydia: NEGATIVE
Comment: NEGATIVE
Comment: NORMAL
Neisseria Gonorrhea: NEGATIVE

## 2020-09-19 ENCOUNTER — Telehealth: Payer: Self-pay | Admitting: Family Medicine

## 2020-09-19 NOTE — Telephone Encounter (Signed)
Copied from CRM [id number]. Topic: General - Call Back - No Documentation >> Sep 19, 2020  4:14 PM Randol Kern wrote: Reason for CRM: Pt's mother is requesting a call back from Erskine Squibb, she states she needs a televisit. Pt doesn't have a cell phone right now, pt needs assistance before her 30 day program with [facility]. Please advise, she is going to need Rx for her depression. 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   Best contact: [phone]

## 2020-09-20 ENCOUNTER — Telehealth: Payer: Self-pay

## 2020-09-20 NOTE — Telephone Encounter (Signed)
Call returned to April # [phone] , message left with call back requested to this CM.  Need to determine if mobile unit will be able to assist with the request for medication. It does not appear that the patient is currently on medication for depression

## 2020-10-17 IMAGING — US US OB < 14 WEEKS - US OB TV
1 series · 15 of 28 positions shown · non-contrast
Comparison: CT 11/10/2018.

CLINICAL DATA: Crampy abdominal pain.

EXAM:
OBSTETRIC <14 WK US AND TRANSVAGINAL OB US
TECHNIQUE: Both transabdominal and transvaginal ultrasound examinations were
performed for complete evaluation of the gestation as well as the
maternal uterus, adnexal regions, and pelvic cul-de-sac.
Transvaginal technique was performed to assess early pregnancy.

[Series 1: us ob < 14 weeks - us ob tv · 42 acquisitions, 15 frames shown]
[im 1/42]
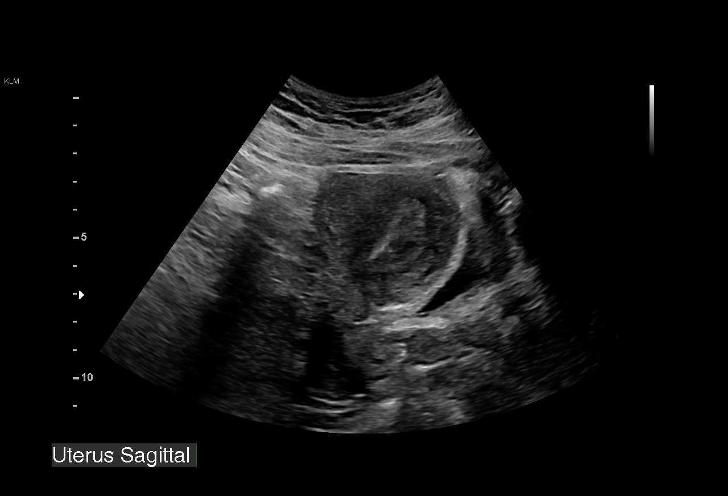
[im 4/42]
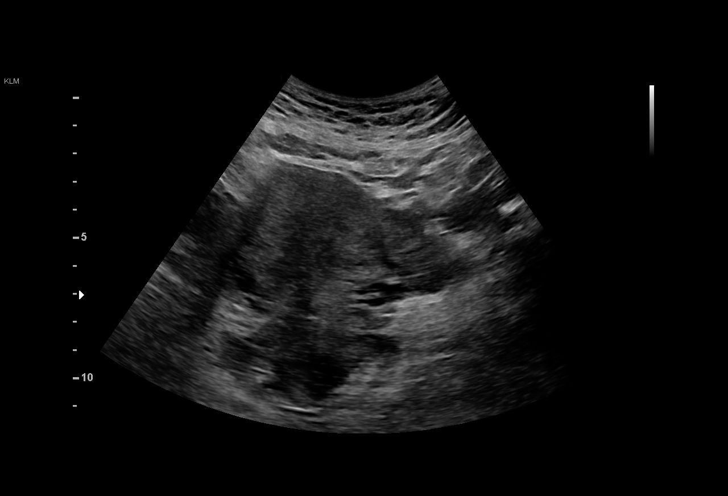
[im 7/42]
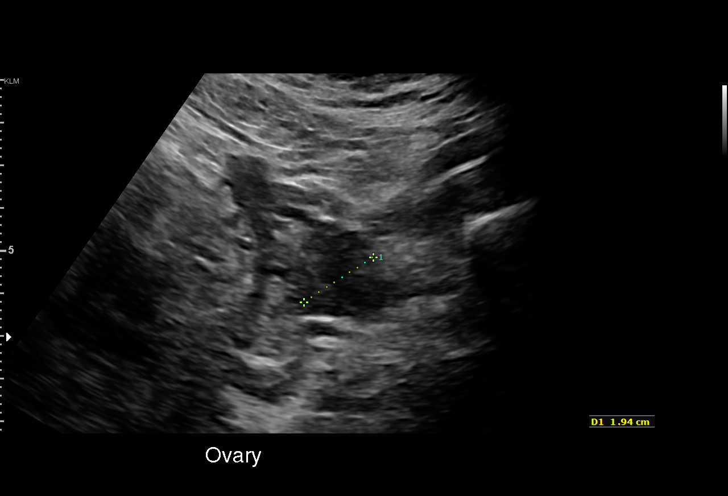
[im 10/42]
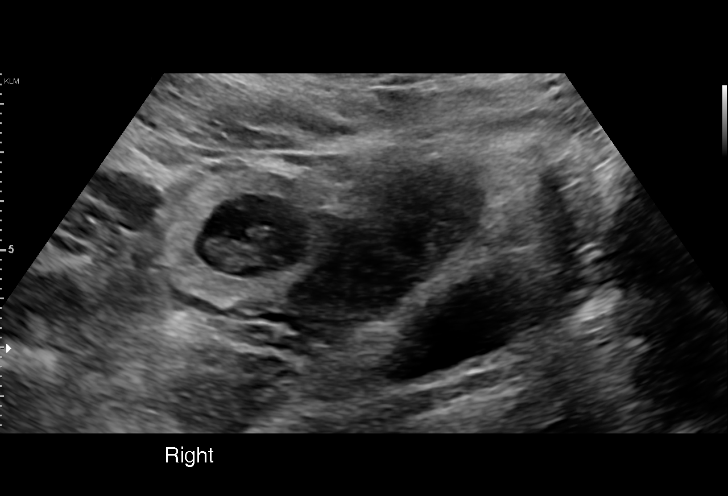
[im 13/42]
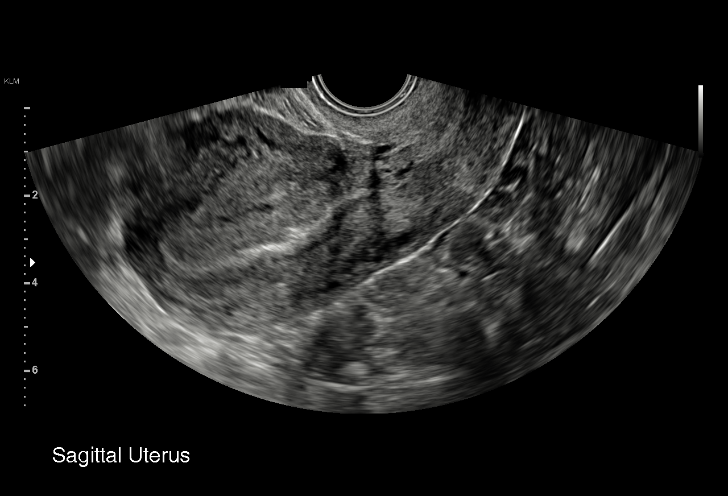
[im 16/42]
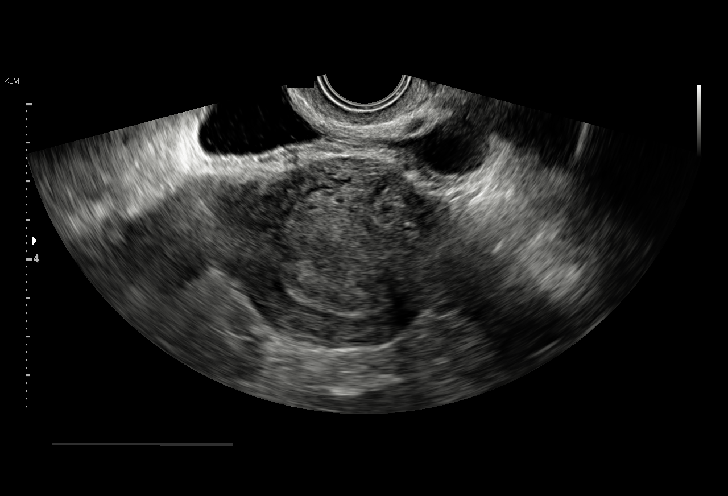
[im 19/42]
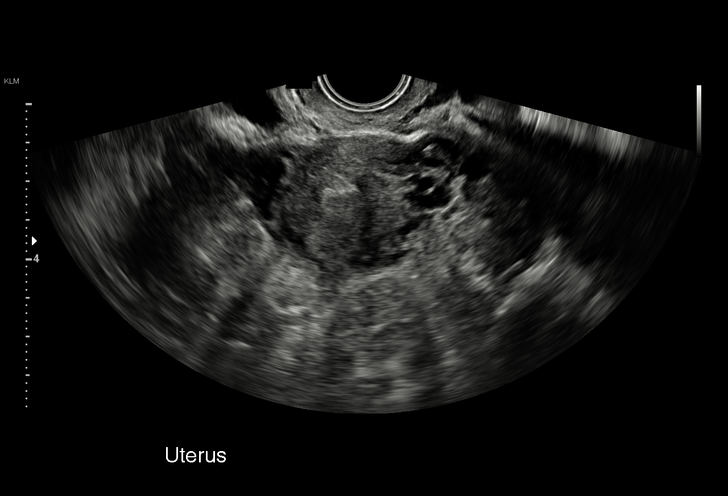
[im 22/42]
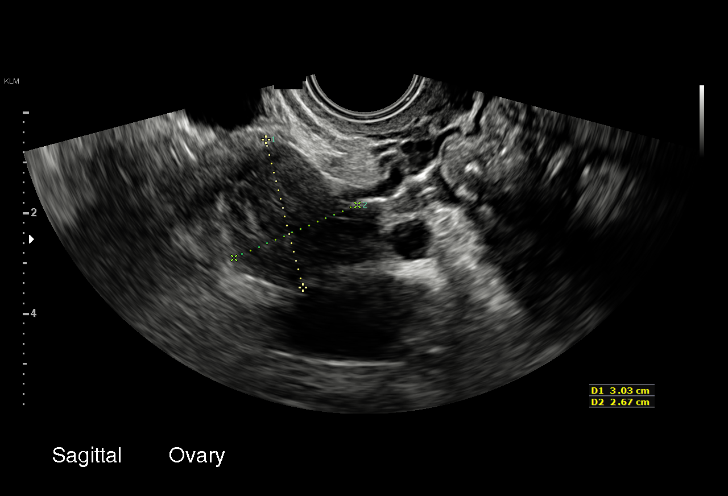
[im 23/42]
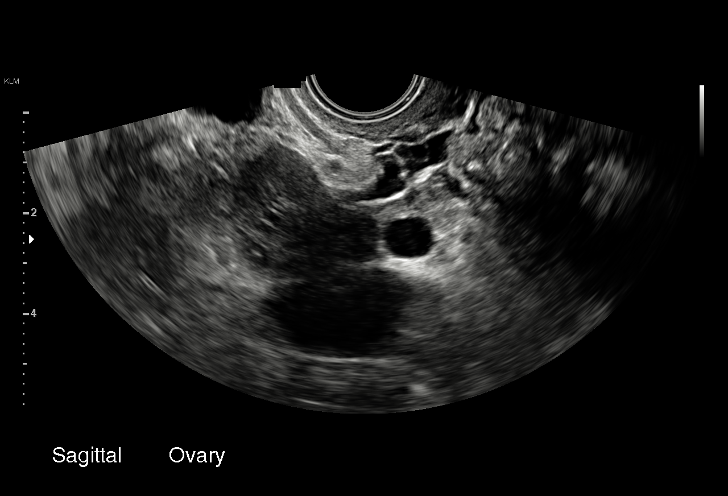
[im 26/42]
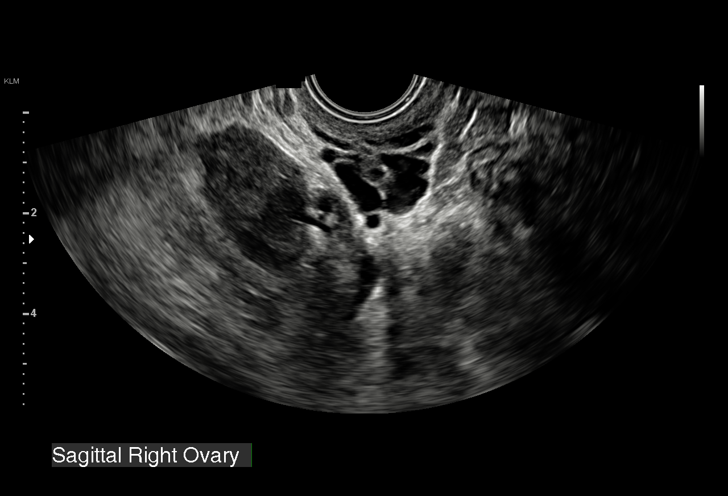
[im 29/42]
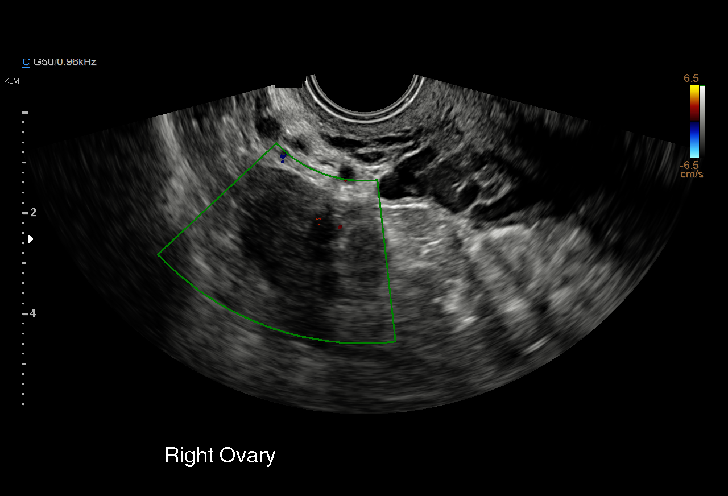
[im 32/42]
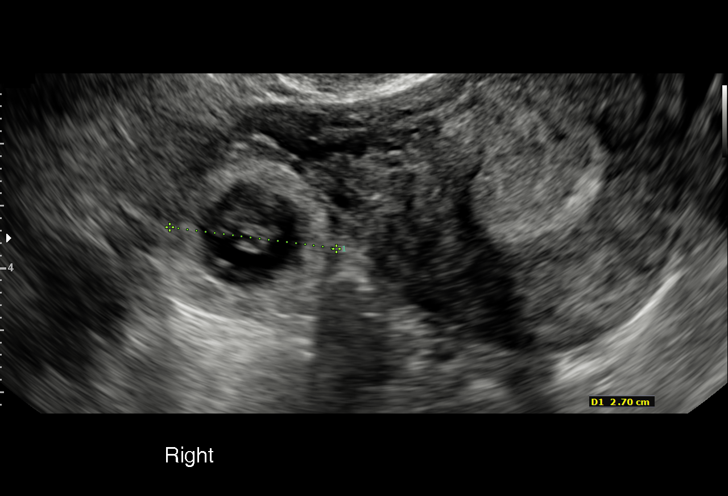
[im 35/42]
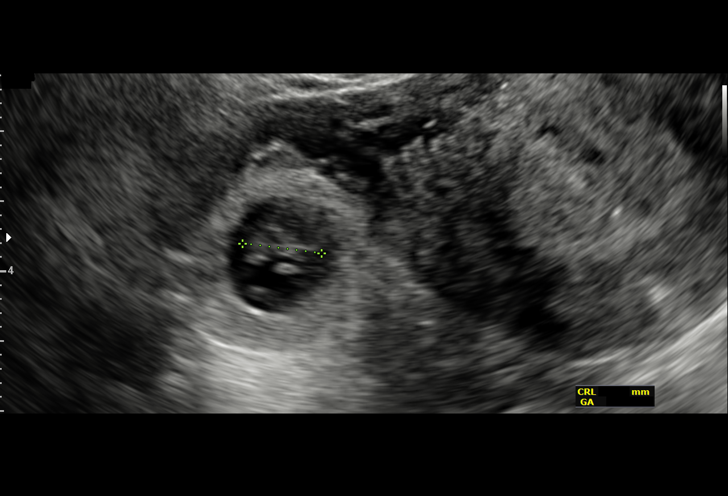
[im 38/42]
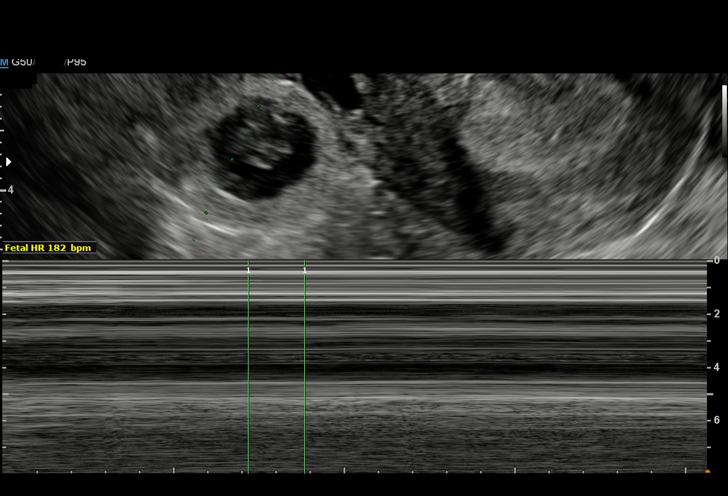
[im 42/42]
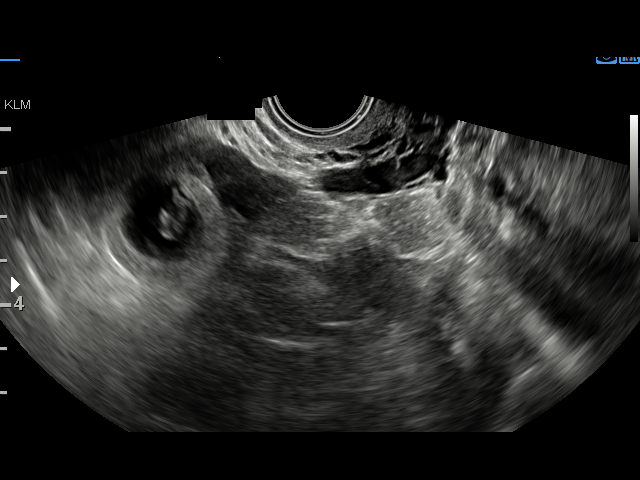

[15 of 28 positions shown; findings below may reference images not displayed]

FINDINGS: Intrauterine gestational sac: None visualized

Right adnexal gestational sac, yolk sac, and embryo with cardiac
activity noted. Findings consistent with live ectopic pregnancy.

Heart Rate: 182 bpm

CRL: 12.2 mm   7 w   3 d                  US EDC: 12/24/2019

Subchorionic hemorrhage:  None visualized.

Maternal uterus/adnexae: 3.2 x 2.8 x 2.7 cm right adnexal mass
consistent with live right adnexal ectopic pregnancy as above.
IMPRESSION: Live right adnexal ectopic pregnancy at 7 weeks 3 days.

Critical Value/emergent results were called by telephone at the time
of interpretation on 05/10/2019 at [DATE] to providerMelanie , who
verbally acknowledged these results.

## 2021-11-23 ENCOUNTER — Other Ambulatory Visit: Payer: Self-pay

## 2021-11-23 ENCOUNTER — Encounter (HOSPITAL_BASED_OUTPATIENT_CLINIC_OR_DEPARTMENT_OTHER): Payer: Self-pay

## 2021-11-23 ENCOUNTER — Emergency Department (HOSPITAL_BASED_OUTPATIENT_CLINIC_OR_DEPARTMENT_OTHER)
Admission: EM | Admit: 2021-11-23 | Discharge: 2021-11-23 | Disposition: A | Discharge status: Home / Self Care | Payer: Medicaid Other | Attending: Emergency Medicine | Admitting: Emergency Medicine

## 2021-11-23 DIAGNOSIS — X58XXXA Exposure to other specified factors, initial encounter: Secondary | ICD-10-CM | POA: Diagnosis not present

## 2021-11-23 DIAGNOSIS — H5789 Other specified disorders of eye and adnexa: Secondary | ICD-10-CM | POA: Diagnosis present

## 2021-11-23 DIAGNOSIS — S0502XA Injury of conjunctiva and corneal abrasion without foreign body, left eye, initial encounter: Secondary | ICD-10-CM | POA: Diagnosis not present

## 2021-11-23 MED ORDER — AMOXICILLIN-POT CLAVULANATE 875-125 MG PO TABS
1.0000 | ORAL_TABLET | Freq: Once | ORAL | Status: AC
  Administered 2021-11-23: 1 via ORAL
  Filled 2021-11-23: qty 1

## 2021-11-23 MED ORDER — POLYMYXIN B-TRIMETHOPRIM 10000-0.1 UNIT/ML-% OP SOLN: 1.0000 [drp] | OPHTHALMIC | 0 refills | Status: AC

## 2021-11-23 MED ORDER — FLUORESCEIN SODIUM 1 MG OP STRP
1.0000 | ORAL_STRIP | Freq: Once | OPHTHALMIC | Status: AC
  Administered 2021-11-23: 1 via OPHTHALMIC
  Filled 2021-11-23: qty 1

## 2021-11-23 MED ORDER — AMOXICILLIN-POT CLAVULANATE 875-125 MG PO TABS: 1.0000 | ORAL_TABLET | Freq: Two times a day (BID) | ORAL | 0 refills | Status: DC

## 2021-11-23 MED ORDER — TETRACAINE HCL 0.5 % OP SOLN
2.0000 [drp] | Freq: Once | OPHTHALMIC | Status: AC
  Administered 2021-11-23: 2 [drp] via OPHTHALMIC
  Filled 2021-11-23: qty 4

## 2021-11-23 NOTE — ED Triage Notes (Signed)
Pt states feels something in her left eye yesterday, attempted to flush area, left eyelid swollen. Denies vision changes, does report excessive tearing of left eye

## 2021-11-23 NOTE — ED Provider Notes (Signed)
[facility] EMERGENCY DEPARTMENT Provider Note   CSN: [id number] Arrival date & time: 11/23/21  1962     History  Chief Complaint  Patient presents with   Eye Problem    Sabrina Walter is a 31 y.o. female.  Patient with left eye discomfort and pain since she was weed eating yesterday.  She was wearing safety glasses but had taken them off and felt something fly into her eye.  She was well prior to this.  She tried flushing her eye out at home without relief.  Today woke up with swelling to her left eyelid and pain.  Denies vision changes.  Denies double vision or blurry vision.  No fevers, chills, nausea or vomiting.  No bleeding from the eye but has had some tearing.  No headache. States her vision is at baseline  The history is provided by the patient.  Eye Problem Associated symptoms: itching and redness   Associated symptoms: no headaches, no nausea, no photophobia, no vomiting and no weakness        Home Medications Prior to Admission medications   Medication Sig Start Date End Date Taking? Authorizing Provider  ibuprofen (ADVIL) 800 MG tablet Take 1 tablet (800 mg total) by mouth every 8 (eight) hours as needed. 05/10/19   Hermina Staggers, MD  methylPREDNISolone (MEDROL DOSEPAK) 4 MG TBPK tablet Follow package insert 02/05/20   Curatolo, Adam, DO  metroNIDAZOLE (FLAGYL) 500 MG tablet Take 1 tablet (500 mg total) by mouth 2 (two) times daily. 07/30/20   Gailen Shelter, PA  polyethylene glycol powder (GLYCOLAX/MIRALAX) 17 GM/SCOOP powder Take 255 g by mouth in the morning, at noon, and at bedtime. One scoop in beverage of your choice with each meal. You may increase if you continue to be constipated and decrease if your stool becomes too loose. 07/30/20   Gailen Shelter, PA      Allergies    Patient has no known allergies.    Review of Systems   Review of Systems  Constitutional:  Negative for activity change, appetite change and fever.  HENT:  Negative for  congestion.   Eyes:  Positive for pain, redness and itching. Negative for photophobia and visual disturbance.  Respiratory:  Negative for cough, chest tightness and shortness of breath.   Cardiovascular:  Negative for chest pain.  Gastrointestinal:  Negative for abdominal pain, nausea and vomiting.  Genitourinary:  Negative for dysuria and hematuria.  Musculoskeletal:  Negative for arthralgias and myalgias.  Skin:  Negative for rash.  Neurological:  Negative for dizziness, weakness and headaches.   all other systems are negative except as noted in the HPI and PMH.    Physical Exam Updated Vital Signs BP 127/77 (BP Location: Right Arm)   Pulse 94   Temp 98.3 F (36.8 C) (Oral)   Resp 18   Ht 5\' 9"  (1.753 m)   Wt 122.5 kg   SpO2 100%   BMI 39.87 kg/m  Physical Exam Vitals and nursing note reviewed.  Constitutional:      General: She is not in acute distress.    Appearance: She is well-developed.  HENT:     Head: Normocephalic and atraumatic.     Mouth/Throat:     Pharynx: No oropharyngeal exudate.  Eyes:     General: Lids are normal. Lids are everted, no foreign bodies appreciated.        Left eye: No foreign body.     Intraocular pressure: Left eye pressure is  15 mmHg. Measurements were taken using a handheld tonometer.    Conjunctiva/sclera:     Left eye: Left conjunctiva is injected. Chemosis present.     Pupils: Pupils are equal, round, and reactive to light.      Comments: Edema to left eyelid.  No erythema.  No pain with extraocular movements.  No hyphema or hypopyon.  Area of fluorescein uptake 9 o'clock position as depicted  Neck:     Comments: No meningismus. Cardiovascular:     Rate and Rhythm: Normal rate and regular rhythm.     Heart sounds: Normal heart sounds. No murmur heard. Pulmonary:     Effort: Pulmonary effort is normal. No respiratory distress.     Breath sounds: Normal breath sounds.  Abdominal:     Palpations: Abdomen is soft.     Tenderness:  There is no abdominal tenderness. There is no guarding or rebound.  Musculoskeletal:        General: No tenderness. Normal range of motion.     Cervical back: Normal range of motion and neck supple.  Skin:    General: Skin is warm.  Neurological:     Mental Status: She is alert and oriented to person, place, and time.     Cranial Nerves: No cranial nerve deficit.     Motor: No abnormal muscle tone.     Coordination: Coordination normal.     Comments:  5/5 strength throughout. CN 2-12 intact.Equal grip strength.   Psychiatric:        Behavior: Behavior normal.     ED Results / Procedures / Treatments   Labs (all labs ordered are listed, but only abnormal results are displayed) Labs Reviewed - No data to display  EKG None  Radiology No results found.  Procedures Procedures    Medications Ordered in ED Medications  fluorescein ophthalmic strip 1 strip (1 strip Both Eyes Given by Other 11/23/21 0858)  tetracaine (PONTOCAINE) 0.5 % ophthalmic solution 2 drop (2 drops Both Eyes Given by Other 11/23/21 1610)    ED Course/ Medical Decision Making/ A&P                           Medical Decision Making Amount and/or Complexity of Data Reviewed Radiology: ordered and independent interpretation performed. Decision-making details documented in ED Course. ECG/medicine tests: ordered and independent interpretation performed. Decision-making details documented in ED Course.  Risk Prescription drug management.  Left eye pain and swelling with foreign body sensation after weed eating yesterday.  No visual changes.  No pain with extraocular movements.  Exam consistent with corneal abrasion without visible foreign body.  Tetanus is up-to-date.  We will treat with topical antibiotics and ophthalmology follow-up.  We will also begin oral antibiotics given concern for possible early preseptal cellulitis.  No evidence of septal cellulitis currently.  Visual Acuity Bilateral Distance:  20/20 R Distance: 20/20 L Distance: 20/20  Discussed topical p.o. antibiotics, warm compresses, ophthalmology follow-up.  Return to the ED for worsening pain, fever, visual changes, pain with eye movements or any other concerns        Final Clinical Impression(s) / ED Diagnoses Final diagnoses:  Abrasion of left cornea, initial encounter    Rx / DC Orders ED Discharge Orders     None         Tempie Gibeault, Jeannett Senior, MD 11/23/21 [phone]

## 2021-11-23 NOTE — ED Notes (Signed)
ED Provider at bedside. 

## 2021-11-23 NOTE — Discharge Instructions (Signed)
Take antibiotics as prescribed.  You are also given antibiotic by mouth for possible early infection involving the eyelid itself.  Follow-up with the ophthalmologist on Monday.  Return to the ED with worsening pain, fever, vomiting, pain with eye movements, any change with your vision or any other concern.

## 2022-01-12 ENCOUNTER — Encounter (HOSPITAL_BASED_OUTPATIENT_CLINIC_OR_DEPARTMENT_OTHER): Payer: Self-pay | Admitting: Emergency Medicine

## 2022-01-12 ENCOUNTER — Emergency Department (HOSPITAL_BASED_OUTPATIENT_CLINIC_OR_DEPARTMENT_OTHER)
Admission: EM | Admit: 2022-01-12 | Discharge: 2022-01-12 | Disposition: A | Discharge status: Home / Self Care | Payer: Medicaid Other | Attending: Emergency Medicine | Admitting: Emergency Medicine

## 2022-01-12 ENCOUNTER — Other Ambulatory Visit: Payer: Self-pay

## 2022-01-12 DIAGNOSIS — R350 Frequency of micturition: Secondary | ICD-10-CM | POA: Diagnosis present

## 2022-01-12 DIAGNOSIS — N760 Acute vaginitis: Secondary | ICD-10-CM | POA: Diagnosis not present

## 2022-01-12 LAB — WET PREP, GENITAL
Clue Cells Wet Prep HPF POC: NONE SEEN
Sperm: NONE SEEN
Trich, Wet Prep: NONE SEEN
WBC, Wet Prep HPF POC: <10 (ref <10)
Yeast Wet Prep HPF POC: NONE SEEN

## 2022-01-12 LAB — URINALYSIS, ROUTINE W REFLEX MICROSCOPIC
Bilirubin Urine: NEGATIVE
Glucose, UA: NEGATIVE mg/dL
Hgb urine dipstick: NEGATIVE
Ketones, ur: NEGATIVE mg/dL
Leukocytes,Ua: NEGATIVE
Nitrite: NEGATIVE
Protein, ur: NEGATIVE mg/dL
Specific Gravity, Urine: 1.025 (ref 1.005–1.030)
pH: 7 (ref 5.0–8.0)

## 2022-01-12 LAB — PREGNANCY, URINE: Preg Test, Ur: NEGATIVE

## 2022-01-12 MED ORDER — METRONIDAZOLE 500 MG PO TABS: 500.0000 mg | ORAL_TABLET | Freq: Two times a day (BID) | ORAL | 0 refills | Status: DC

## 2022-01-12 NOTE — ED Triage Notes (Signed)
Pt arrives pov, steady gait, c/o lower abd. Pain with left lower back pain , urinary freq x 1 month

## 2022-01-12 NOTE — Discharge Instructions (Addendum)
Follow up with your Physician for recheck  

## 2022-01-13 LAB — GC/CHLAMYDIA PROBE AMP (~~LOC~~) NOT AT ARMC
Chlamydia: NEGATIVE
Comment: NEGATIVE
Comment: NORMAL
Neisseria Gonorrhea: NEGATIVE

## 2022-01-14 NOTE — ED Provider Notes (Signed)
[facility] EMERGENCY DEPARTMENT Provider Note   CSN: [id number] Arrival date & time: 01/12/22  0940     History  Chief Complaint  Patient presents with   Urinary Frequency    Sabrina Walter is a 31 y.o. female.  Pt complains of burning with urination.  Pt concerned that she has trichomonas   The history is provided by the patient. No language interpreter was used.  Urinary Frequency This is a new problem. Episode onset: 1 month. The problem occurs constantly. The problem has not changed since onset.Pertinent negatives include no abdominal pain. Nothing aggravates the symptoms. Nothing relieves the symptoms. She has tried nothing for the symptoms.       Home Medications Prior to Admission medications   Medication Sig Start Date End Date Taking? Authorizing Provider  amoxicillin-clavulanate (AUGMENTIN) 875-125 MG tablet Take 1 tablet by mouth every 12 (twelve) hours. 11/23/21   Rancour, Jeannett Senior, MD  ibuprofen (ADVIL) 800 MG tablet Take 1 tablet (800 mg total) by mouth every 8 (eight) hours as needed. 05/10/19   Hermina Staggers, MD  methylPREDNISolone (MEDROL DOSEPAK) 4 MG TBPK tablet Follow package insert 02/05/20   Curatolo, Adam, DO  metroNIDAZOLE (FLAGYL) 500 MG tablet Take 1 tablet (500 mg total) by mouth 2 (two) times daily. 01/12/22   Elson Areas, PA-C  polyethylene glycol powder (GLYCOLAX/MIRALAX) 17 GM/SCOOP powder Take 255 g by mouth in the morning, at noon, and at bedtime. One scoop in beverage of your choice with each meal. You may increase if you continue to be constipated and decrease if your stool becomes too loose. 07/30/20   Gailen Shelter, PA      Allergies    Patient has no known allergies.    Review of Systems   Review of Systems  Gastrointestinal:  Negative for abdominal pain.  Genitourinary:  Positive for frequency.  All other systems reviewed and are negative.   Physical Exam Updated Vital Signs BP 116/69 (BP Location: Right Arm)    Pulse 76   Temp 98.2 F (36.8 C) (Oral)   Resp 17   Ht 5\' 9"  (1.753 m)   Wt (!) 139.3 kg   LMP 12/11/2021   SpO2 99%   BMI 45.34 kg/m  Physical Exam Vitals and nursing note reviewed.  Constitutional:      General: She is not in acute distress.    Appearance: She is well-developed.  HENT:     Head: Normocephalic and atraumatic.  Eyes:     Conjunctiva/sclera: Conjunctivae normal.  Cardiovascular:     Rate and Rhythm: Normal rate and regular rhythm.     Heart sounds: No murmur heard. Pulmonary:     Effort: Pulmonary effort is normal. No respiratory distress.     Breath sounds: Normal breath sounds.  Abdominal:     Palpations: Abdomen is soft.     Tenderness: There is no abdominal tenderness.  Genitourinary:    Vagina: Vaginal discharge present.  Musculoskeletal:        General: No swelling.     Cervical back: Neck supple.  Skin:    General: Skin is warm and dry.     Capillary Refill: Capillary refill takes less than 2 seconds.  Neurological:     Mental Status: She is alert.  Psychiatric:        Mood and Affect: Mood normal.     ED Results / Procedures / Treatments   Labs (all labs ordered are listed, but only abnormal results are displayed)  Labs Reviewed  WET PREP, GENITAL  URINALYSIS, ROUTINE W REFLEX MICROSCOPIC  PREGNANCY, URINE  GC/CHLAMYDIA PROBE AMP ([place]) NOT AT [facility]    EKG None  Radiology No results found.  Procedures Procedures    Medications Ordered in ED Medications - No data to display  ED Course/ Medical Decision Making/ A&P                           Medical Decision Making Pt concerned she may have a vaginal infection   Amount and/or Complexity of Data Reviewed Labs: ordered. Decision-making details documented in ED Course.    Details: Labs ordered reviewed and interpreted.  Wet prep is negative ua is negative.  Gc and Ct pending   Risk Prescription drug management. Risk Details: Pt given rx for flagl             Final Clinical Impression(s) / ED Diagnoses Final diagnoses:  Acute vaginitis    Rx / DC Orders ED Discharge Orders          Ordered    metroNIDAZOLE (FLAGYL) 500 MG tablet  2 times daily        01/12/22 1226           An After Visit Summary was printed and given to the patient.    Elson Areas, PA-C 01/14/22 1830    Melene Plan, DO 01/15/22 [phone]

## 2022-08-01 ENCOUNTER — Other Ambulatory Visit: Payer: Self-pay

## 2022-08-01 ENCOUNTER — Emergency Department (HOSPITAL_BASED_OUTPATIENT_CLINIC_OR_DEPARTMENT_OTHER)
Admission: EM | Admit: 2022-08-01 | Discharge: 2022-08-01 | Disposition: A | Discharge status: Home / Self Care | Payer: Medicaid Other | Attending: Emergency Medicine | Admitting: Emergency Medicine

## 2022-08-01 DIAGNOSIS — X58XXXA Exposure to other specified factors, initial encounter: Secondary | ICD-10-CM | POA: Insufficient documentation

## 2022-08-01 DIAGNOSIS — S025XXA Fracture of tooth (traumatic), initial encounter for closed fracture: Secondary | ICD-10-CM | POA: Insufficient documentation

## 2022-08-01 DIAGNOSIS — K029 Dental caries, unspecified: Secondary | ICD-10-CM | POA: Insufficient documentation

## 2022-08-01 DIAGNOSIS — K0889 Other specified disorders of teeth and supporting structures: Secondary | ICD-10-CM | POA: Diagnosis present

## 2022-08-01 MED ORDER — AMOXICILLIN 500 MG PO CAPS: 500.0000 mg | ORAL_CAPSULE | Freq: Three times a day (TID) | ORAL | 0 refills | Status: DC

## 2022-08-01 MED ORDER — AMOXICILLIN 500 MG PO CAPS
500.0000 mg | ORAL_CAPSULE | Freq: Once | ORAL | Status: AC
  Administered 2022-08-01: 500 mg via ORAL

## 2022-08-01 MED ORDER — IBUPROFEN 800 MG PO TABS
800.0000 mg | ORAL_TABLET | Freq: Once | ORAL | Status: AC
  Administered 2022-08-01: 800 mg via ORAL

## 2022-08-01 MED ORDER — ACETAMINOPHEN 500 MG PO TABS: 500.0000 mg | ORAL_TABLET | Freq: Four times a day (QID) | ORAL | 0 refills | Status: AC | PRN

## 2022-08-01 MED ORDER — IBUPROFEN 800 MG PO TABS: 800.0000 mg | ORAL_TABLET | Freq: Three times a day (TID) | ORAL | 1 refills | Status: AC

## 2022-08-01 NOTE — ED Triage Notes (Signed)
Patient presents to ED via POV from home. Here with dental pain. Reports known "bad teeth". Reports right upper and left lower dental pain.

## 2022-08-01 NOTE — ED Provider Notes (Signed)
[place] [facility] Provider Note   CSN: [id number] Arrival date & time: 08/01/22  1411     History  Chief Complaint  Patient presents with   Dental Pain    Sabrina Walter is a 32 y.o. female presenting today with dental pain.  She reports has been going on for 2 days but she has had a broken molars in her mouth for several years.  She has an appointment with a dentist on 3/25.  She has tried Tylenol for her pain without much relief.  No trismus, trouble breathing or swallowing.   Dental Pain      Home Medications Prior to Admission medications   Medication Sig Start Date End Date Taking? Authorizing Provider  acetaminophen (TYLENOL) 500 MG tablet Take 1 tablet (500 mg total) by mouth every 6 (six) hours as needed. 08/01/22  Yes Merrilyn Legler A, PA-C  amoxicillin (AMOXIL) 500 MG capsule Take 1 capsule (500 mg total) by mouth 3 (three) times daily. 08/01/22  Yes Lareta Bruneau A, PA-C  ibuprofen (ADVIL) 800 MG tablet Take 1 tablet (800 mg total) by mouth 3 (three) times daily. 08/01/22  Yes Karlyn Glasco A, PA-C  amoxicillin-clavulanate (AUGMENTIN) 875-125 MG tablet Take 1 tablet by mouth every 12 (twelve) hours. 11/23/21   Rancour, Annie Main, MD  ibuprofen (ADVIL) 800 MG tablet Take 1 tablet (800 mg total) by mouth every 8 (eight) hours as needed. 05/10/19   Chancy Milroy, MD  methylPREDNISolone (MEDROL DOSEPAK) 4 MG TBPK tablet Follow package insert 02/05/20   Curatolo, Adam, DO  metroNIDAZOLE (FLAGYL) 500 MG tablet Take 1 tablet (500 mg total) by mouth 2 (two) times daily. 01/12/22   Fransico Meadow, PA-C  polyethylene glycol powder (GLYCOLAX/MIRALAX) 17 GM/SCOOP powder Take 255 g by mouth in the morning, at noon, and at bedtime. One scoop in beverage of your choice with each meal. You may increase if you continue to be constipated and decrease if your stool becomes too loose. 07/30/20   Tedd Sias, PA      Allergies    Patient has no  known allergies.    Review of Systems   Review of Systems  Physical Exam Updated Vital Signs BP 114/75 (BP Location: Left Arm)   Pulse 87   Temp 98.1 F (36.7 C) (Oral)   Resp 18   Ht 5\' 9"  (1.753 m)   Wt 135.6 kg   SpO2 100%   BMI 44.15 kg/m  Physical Exam Vitals and nursing note reviewed.  Constitutional:      Appearance: Normal appearance.  HENT:     Head: Normocephalic and atraumatic.     Mouth/Throat:     Comments: Fractured right upper and left lower molars.  Appears to have erupting wisdom tooth on the left lower gumline.  No abscess.  No PTA/RPA, airway clear and tolerating secretions.  No signs of Ludewig's angina. Eyes:     General: No scleral icterus.    Conjunctiva/sclera: Conjunctivae normal.  Pulmonary:     Effort: Pulmonary effort is normal. No respiratory distress.  Skin:    Findings: No rash.  Neurological:     Mental Status: She is alert.  Psychiatric:        Mood and Affect: Mood normal.     ED Results / Procedures / Treatments   Labs (all labs ordered are listed, but only abnormal results are displayed) Labs Reviewed - No data to display  EKG None  Radiology No results  found.  Procedures Procedures    Medications Ordered in ED Medications  amoxicillin (AMOXIL) capsule 500 mg (500 mg Oral Given 08/01/22 1516)  ibuprofen (ADVIL) tablet 800 mg (800 mg Oral Given 08/01/22 1516)    ED Course/ Medical Decision Making/ A&P                             Medical Decision Making Risk OTC drugs. Prescription drug management.   32 year old female presenting with dental pain.  Has been going on for 2 days.  Has had broken teeth for multiple years.  Has an appointment with dentist in 10 days.  Airway was clear and she was tolerating secretions.  She was given a dose of amoxicillin and ibuprofen in the department.  She says that she believes ibuprofen works better than Tylenol and requests that this be sent to her pharmacy. 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  She is stable and will be discharged at this time.  Final Clinical Impression(s) / ED Diagnoses Final diagnoses:  Pain due to dental caries    Rx / DC Orders ED Discharge Orders          Ordered    ibuprofen (ADVIL) 800 MG tablet  3 times daily        08/01/22 1517    amoxicillin (AMOXIL) 500 MG capsule  3 times daily        08/01/22 1517    acetaminophen (TYLENOL) 500 MG tablet  Every 6 hours PRN        08/01/22 1517           Results and diagnoses were explained to the patient. Return precautions discussed in full. Patient had no additional questions and expressed complete understanding.   This chart was dictated using voice recognition software.  Despite best efforts to proofread,  errors can occur which can change the documentation meaning.    Darliss Ridgel 08/01/22 1520    Tretha Sciara, MD 08/01/22 1821

## 2022-08-01 NOTE — Discharge Instructions (Addendum)
You are being started on antibiotics.  Take them with food as they may upset your stomach.  Ibuprofen has been sent to your pharmacy.  I also sent extra strength Tylenol.  Keep your appointment with the dentist in 10 days.  It was a pleasure to meet you and we hope you feel better.

## 2022-09-25 ENCOUNTER — Encounter (HOSPITAL_BASED_OUTPATIENT_CLINIC_OR_DEPARTMENT_OTHER): Payer: Self-pay | Admitting: Emergency Medicine

## 2022-09-25 ENCOUNTER — Emergency Department (HOSPITAL_BASED_OUTPATIENT_CLINIC_OR_DEPARTMENT_OTHER)
Admission: EM | Admit: 2022-09-25 | Discharge: 2022-09-25 | Disposition: A | Discharge status: Home / Self Care | Payer: Medicaid Other | Attending: Emergency Medicine | Admitting: Emergency Medicine

## 2022-09-25 ENCOUNTER — Other Ambulatory Visit: Payer: Self-pay

## 2022-09-25 DIAGNOSIS — K047 Periapical abscess without sinus: Secondary | ICD-10-CM | POA: Diagnosis not present

## 2022-09-25 DIAGNOSIS — R220 Localized swelling, mass and lump, head: Secondary | ICD-10-CM | POA: Diagnosis present

## 2022-09-25 MED ORDER — AMOXICILLIN-POT CLAVULANATE 875-125 MG PO TABS: 1.0000 | ORAL_TABLET | Freq: Two times a day (BID) | ORAL | 0 refills | Status: AC

## 2022-09-25 MED ORDER — AMOXICILLIN-POT CLAVULANATE 875-125 MG PO TABS
1.0000 | ORAL_TABLET | Freq: Once | ORAL | Status: AC
  Administered 2022-09-25: 1 via ORAL
  Filled 2022-09-25: qty 1

## 2022-09-25 MED ORDER — IBUPROFEN 800 MG PO TABS
800.0000 mg | ORAL_TABLET | Freq: Once | ORAL | Status: AC
  Administered 2022-09-25: 800 mg via ORAL
  Filled 2022-09-25: qty 1

## 2022-09-25 NOTE — ED Provider Notes (Signed)
[place] [facility] Provider Note   CSN: [id number] Arrival date & time: 09/25/22  1850     History  Chief Complaint  Patient presents with   Oral Swelling    Sabrina Walter is a 32 y.o. female presents to the ED complaining of dental pain and facial swelling that began yesterday.  Patient has had problems with this same area in the past and has had to take antibiotics for it.  She has not yet seen a dentist, but does have an appointment scheduled for tomorrow morning.  Patient took 800 mg of ibuprofen this morning with minimal relief.  She also reports painful swallowing.  Denies fever, trouble swallowing, voice change, shortness of breath, stridor.         Home Medications Prior to Admission medications   Medication Sig Start Date End Date Taking? Authorizing Provider  amoxicillin-clavulanate (AUGMENTIN) 875-125 MG tablet Take 1 tablet by mouth every 12 (twelve) hours. 09/25/22  Yes Terrianne Cavness R, PA-C  acetaminophen (TYLENOL) 500 MG tablet Take 1 tablet (500 mg total) by mouth every 6 (six) hours as needed. 08/01/22   Redwine, Madison A, PA-C  ibuprofen (ADVIL) 800 MG tablet Take 1 tablet (800 mg total) by mouth every 8 (eight) hours as needed. 05/10/19   Hermina Staggers, MD  ibuprofen (ADVIL) 800 MG tablet Take 1 tablet (800 mg total) by mouth 3 (three) times daily. 08/01/22   Redwine, Madison A, PA-C  methylPREDNISolone (MEDROL DOSEPAK) 4 MG TBPK tablet Follow package insert 02/05/20   Curatolo, Adam, DO  polyethylene glycol powder (GLYCOLAX/MIRALAX) 17 GM/SCOOP powder Take 255 g by mouth in the morning, at noon, and at bedtime. One scoop in beverage of your choice with each meal. You may increase if you continue to be constipated and decrease if your stool becomes too loose. 07/30/20   Gailen Shelter, PA      Allergies    Patient has no known allergies.    Review of Systems   Review of Systems  Constitutional:  Negative for fever.  HENT:   Positive for dental problem, facial swelling and sore throat. Negative for trouble swallowing and voice change.   Respiratory:  Negative for shortness of breath and stridor.     Physical Exam Updated Vital Signs BP (!) 144/93 (BP Location: Left Arm)   Pulse 96   Temp 98.8 F (37.1 C) (Oral)   Resp 16   Ht 5\' 9"  (1.753 m)   Wt (!) 141.5 kg   LMP 09/11/2022 (Approximate)   SpO2 98%   BMI 46.07 kg/m  Physical Exam Vitals and nursing note reviewed.  Constitutional:      General: She is not in acute distress.    Appearance: Normal appearance. She is not ill-appearing or diaphoretic.  HENT:     Head:     Jaw: There is normal jaw occlusion. Tenderness, swelling (over left mandible) and pain on movement present. No trismus or malocclusion.     Mouth/Throat:     Lips: Pink.     Mouth: Mucous membranes are moist.     Dentition: Dental tenderness, gingival swelling, dental caries and dental abscesses present.     Pharynx: Oropharynx is clear. Uvula midline. No pharyngeal swelling or posterior oropharyngeal erythema.     Comments: No peritonsillar swelling or pharyngeal swelling.  Gingival swelling and tenderness over lower left molars.  No obvious drainage or bleeding.  Furthest molar in the back appears broken.   Cardiovascular:  Rate and Rhythm: Normal rate and regular rhythm.  Pulmonary:     Effort: Pulmonary effort is normal.  Lymphadenopathy:     Cervical: No cervical adenopathy.  Neurological:     Mental Status: She is alert. Mental status is at baseline.  Psychiatric:        Mood and Affect: Mood normal.        Behavior: Behavior normal.     ED Results / Procedures / Treatments   Labs (all labs ordered are listed, but only abnormal results are displayed) Labs Reviewed - No data to display  EKG None  Radiology No results found.  Procedures Procedures    Medications Ordered in ED Medications  amoxicillin-clavulanate (AUGMENTIN) 875-125 MG per tablet 1 tablet  (1 tablet Oral Given 09/25/22 2032)  ibuprofen (ADVIL) tablet 800 mg (800 mg Oral Given 09/25/22 2032)    ED Course/ Medical Decision Making/ A&P                             Medical Decision Making Risk Prescription drug management.   Patient presents to the ED complaining of facial swelling and pain that began yesterday.  Patient has had trouble with same teeth in the past and has had to be on antibiotics.  Patient has not yet seen a dentist, but does have appointment scheduled tomorrow.   Differential diagnosis includes: periapical abscess, peridontal abscess, pulpitis, gingivitis, Ludwig's angina, peritonsillar abscess, retropharyngeal abscess  Exam significant for swelling over the left mandible that does not extend submentally.  No appreciable cervical lymphadenopathy.  Mandible is tender to palpation.  There is gingival swelling and dental tenderness on the lower left around the back 2 molars.  No active drainage or bleeding.  Will treat patient with antibiotics for suspected dental abscess.  Patient has appointment scheduled tomorrow morning with dentist.  Advised patient to keep her appointment and see the dentist in the morning.  Discussed supportive care measures for pain and swelling at home including use of NSAIDs, Tylenol, and cool compresses to the face.    The patient has been appropriately medically screened and/or stabilized in the ED. I have low suspicion for any other emergent medical condition which would require further screening, evaluation or treatment in the ED or require inpatient management. At time of discharge the patient is hemodynamically stable and in no acute distress. I have discussed work-up results and diagnosis with patient and answered all questions. Patient is agreeable with discharge plan. We discussed strict return precautions for returning to the emergency department and they verbalized understanding.           Final Clinical Impression(s) / ED  Diagnoses Final diagnoses:  Dental abscess    Rx / DC Orders ED Discharge Orders          Ordered    amoxicillin-clavulanate (AUGMENTIN) 875-125 MG tablet  Every 12 hours        09/25/22 2030              Lenard Simmer, PA-C 09/25/22 2154    Rondel Baton, MD 09/26/22 1323

## 2022-09-25 NOTE — ED Triage Notes (Signed)
Patient arrived via POV c/o dental pain and lower right side left side jaw swelling x 1 week . Patient states worsening swelling over last 2 days. Patient states some difficulty swallowing at this point. Patient tearful in triage. Patient is AO x 4, VS WDL, normal gait.

## 2022-09-25 NOTE — ED Notes (Signed)
Cannot discharge, registration in chart.   

## 2022-09-25 NOTE — Discharge Instructions (Addendum)
Thank you for allowing me to be part of your care today.    You were given your first dose of antibiotic while in the pharmacy.  The remainder of the prescription was sent to your pharmacy.  Please take this as prescribed.  Keep your dental appointment that you have scheduled tomorrow.   You may alternate 800 mg of ibuprofen with 1000 mg of Tylenol every 4 hours as needed for pain.  DO NOT exceed 4000 mg of Tylenol or 3200 mg of ibuprofen from all sources within a 24-hour period.  I also recommend using ice or cool compresses to your face for no longer than 20 minutes at a time to help with swelling.    Return to the ED if you develop sudden worsening of your symptoms or if you have any new concerns.
